# Patient Record
Sex: Female | Born: 1980 | Race: White | Hispanic: No | Smoking: Current every day smoker
Health system: Southern US, Community
[De-identification: ages and names within clinical notes are randomized; demographics above are authoritative.]

## PROBLEM LIST (undated history)

## (undated) DIAGNOSIS — F1990 Other psychoactive substance use, unspecified, uncomplicated: Secondary | ICD-10-CM

## (undated) DIAGNOSIS — F419 Anxiety disorder, unspecified: Secondary | ICD-10-CM

## (undated) DIAGNOSIS — F1910 Other psychoactive substance abuse, uncomplicated: Secondary | ICD-10-CM

---

## 2003-01-31 ENCOUNTER — Inpatient Hospital Stay (HOSPITAL_COMMUNITY): Admission: AD | Admit: 2003-01-31 | Discharge: 2003-02-03 | Payer: Self-pay | Admitting: Obstetrics and Gynecology

## 2003-03-13 ENCOUNTER — Other Ambulatory Visit: Admission: RE | Admit: 2003-03-13 | Discharge: 2003-03-13 | Payer: Self-pay | Admitting: Obstetrics and Gynecology

## 2005-01-15 ENCOUNTER — Other Ambulatory Visit: Admission: RE | Admit: 2005-01-15 | Discharge: 2005-01-15 | Payer: Self-pay | Admitting: Obstetrics and Gynecology

## 2005-01-19 ENCOUNTER — Ambulatory Visit (HOSPITAL_COMMUNITY): Admission: RE | Admit: 2005-01-19 | Discharge: 2005-01-19 | Payer: Self-pay | Admitting: Obstetrics and Gynecology

## 2005-08-08 ENCOUNTER — Inpatient Hospital Stay (HOSPITAL_COMMUNITY): Admission: AD | Admit: 2005-08-08 | Discharge: 2005-08-10 | Payer: Self-pay | Admitting: Obstetrics and Gynecology

## 2016-09-08 ENCOUNTER — Emergency Department (HOSPITAL_COMMUNITY): Payer: Self-pay

## 2016-09-08 ENCOUNTER — Encounter (HOSPITAL_COMMUNITY): Payer: Self-pay | Admitting: Radiology

## 2016-09-08 ENCOUNTER — Emergency Department (HOSPITAL_COMMUNITY)
Admission: EM | Admit: 2016-09-08 | Discharge: 2016-09-09 | Disposition: A | Discharge status: Other Acute Inpatient Hospital | Payer: Self-pay | Attending: Emergency Medicine | Admitting: Emergency Medicine

## 2016-09-08 DIAGNOSIS — Y999 Unspecified external cause status: Secondary | ICD-10-CM | POA: Insufficient documentation

## 2016-09-08 DIAGNOSIS — Y92410 Unspecified street and highway as the place of occurrence of the external cause: Secondary | ICD-10-CM | POA: Insufficient documentation

## 2016-09-08 DIAGNOSIS — R930 Abnormal findings on diagnostic imaging of skull and head, not elsewhere classified: Secondary | ICD-10-CM | POA: Insufficient documentation

## 2016-09-08 DIAGNOSIS — S161XXA Strain of muscle, fascia and tendon at neck level, initial encounter: Secondary | ICD-10-CM | POA: Insufficient documentation

## 2016-09-08 DIAGNOSIS — R402431 Glasgow coma scale score 3-8, in the field [EMT or ambulance]: Secondary | ICD-10-CM | POA: Insufficient documentation

## 2016-09-08 DIAGNOSIS — R938 Abnormal findings on diagnostic imaging of other specified body structures: Secondary | ICD-10-CM | POA: Insufficient documentation

## 2016-09-08 DIAGNOSIS — Y939 Activity, unspecified: Secondary | ICD-10-CM | POA: Insufficient documentation

## 2016-09-08 DIAGNOSIS — F17210 Nicotine dependence, cigarettes, uncomplicated: Secondary | ICD-10-CM | POA: Insufficient documentation

## 2016-09-08 DIAGNOSIS — Z79899 Other long term (current) drug therapy: Secondary | ICD-10-CM | POA: Insufficient documentation

## 2016-09-08 DIAGNOSIS — R079 Chest pain, unspecified: Secondary | ICD-10-CM | POA: Insufficient documentation

## 2016-09-08 LAB — URINALYSIS, ROUTINE W REFLEX MICROSCOPIC
Bilirubin Urine: NEGATIVE
Glucose, UA: NEGATIVE mg/dL
HGB URINE DIPSTICK: NEGATIVE
Ketones, ur: NEGATIVE mg/dL
Leukocytes, UA: NEGATIVE
NITRITE: NEGATIVE
PH: 5 (ref 5.0–8.0)
Protein, ur: NEGATIVE mg/dL
SPECIFIC GRAVITY, URINE: 1.04 — AB (ref 1.005–1.030)

## 2016-09-08 LAB — CBC
HEMATOCRIT: 39.2 % (ref 36.0–46.0)
HEMOGLOBIN: 12.8 g/dL (ref 12.0–15.0)
MCH: 28.8 pg (ref 26.0–34.0)
MCHC: 32.7 g/dL (ref 30.0–36.0)
MCV: 88.3 fL (ref 78.0–100.0)
Platelets: 369 10*3/uL (ref 150–400)
RBC: 4.44 MIL/uL (ref 3.87–5.11)
RDW: 14.9 % (ref 11.5–15.5)
WBC: 16.6 10*3/uL — ABNORMAL HIGH (ref 4.0–10.5)

## 2016-09-08 LAB — COMPREHENSIVE METABOLIC PANEL
ALBUMIN: 3.8 g/dL (ref 3.5–5.0)
ALT: 14 U/L (ref 14–54)
ANION GAP: 8 (ref 5–15)
AST: 25 U/L (ref 15–41)
Alkaline Phosphatase: 51 U/L (ref 38–126)
BILIRUBIN TOTAL: 0.4 mg/dL (ref 0.3–1.2)
BUN: 8 mg/dL (ref 6–20)
CHLORIDE: 107 mmol/L (ref 101–111)
CO2: 24 mmol/L (ref 22–32)
Calcium: 9 mg/dL (ref 8.9–10.3)
Creatinine, Ser: 1.2 mg/dL — ABNORMAL HIGH (ref 0.44–1.00)
GFR calc Af Amer: >60 mL/min (ref >60)
GFR calc non Af Amer: 57 mL/min — ABNORMAL LOW (ref >60)
GLUCOSE: 79 mg/dL (ref 65–99)
POTASSIUM: 3.9 mmol/L (ref 3.5–5.1)
SODIUM: 139 mmol/L (ref 135–145)
TOTAL PROTEIN: 6.3 g/dL — AB (ref 6.5–8.1)

## 2016-09-08 LAB — PREPARE FRESH FROZEN PLASMA
UNIT DIVISION: 0
Unit division: 0

## 2016-09-08 LAB — RAPID URINE DRUG SCREEN, HOSP PERFORMED
Amphetamines: POSITIVE — AB
Barbiturates: NOT DETECTED
Benzodiazepines: POSITIVE — AB
COCAINE: NOT DETECTED
OPIATES: POSITIVE — AB
Tetrahydrocannabinol: POSITIVE — AB

## 2016-09-08 LAB — SALICYLATE LEVEL

## 2016-09-08 LAB — I-STAT CHEM 8, ED
BUN: 8 mg/dL (ref 6–20)
CALCIUM ION: 1.09 mmol/L — AB (ref 1.15–1.40)
Chloride: 106 mmol/L (ref 101–111)
Creatinine, Ser: 1.1 mg/dL — ABNORMAL HIGH (ref 0.44–1.00)
Glucose, Bld: 89 mg/dL (ref 65–99)
HEMATOCRIT: 37 % (ref 36.0–46.0)
HEMOGLOBIN: 12.6 g/dL (ref 12.0–15.0)
Potassium: 3.5 mmol/L (ref 3.5–5.1)
SODIUM: 141 mmol/L (ref 135–145)
TCO2: 24 mmol/L (ref 0–100)

## 2016-09-08 LAB — I-STAT CG4 LACTIC ACID, ED
LACTIC ACID, VENOUS: 4.14 mmol/L — AB (ref 0.5–1.9)
Lactic Acid, Venous: 1.3 mmol/L (ref 0.5–1.9)

## 2016-09-08 LAB — CDS SEROLOGY

## 2016-09-08 LAB — ABO/RH: ABO/RH(D): A POS

## 2016-09-08 LAB — I-STAT BETA HCG BLOOD, ED (MC, WL, AP ONLY)

## 2016-09-08 LAB — ETHANOL: Alcohol, Ethyl (B): <5 mg/dL (ref <5)

## 2016-09-08 LAB — ACETAMINOPHEN LEVEL: Acetaminophen (Tylenol), Serum: <10 ug/mL — ABNORMAL LOW (ref 10–30)

## 2016-09-08 LAB — PROTIME-INR
INR: 1.09
Prothrombin Time: 14.2 seconds (ref 11.4–15.2)

## 2016-09-08 MED ORDER — LORAZEPAM 1 MG PO TABS
1.0000 mg | ORAL_TABLET | Freq: Four times a day (QID) | ORAL | Status: DC | PRN
  Administered 2016-09-08 – 2016-09-09 (×2): 1 mg via ORAL
  Filled 2016-09-08 (×2): qty 1

## 2016-09-08 MED ORDER — IOPAMIDOL (ISOVUE-300) INJECTION 61%
100.0000 mL | Freq: Once | INTRAVENOUS | Status: AC | PRN
  Administered 2016-09-08: 100 mL via INTRAVENOUS

## 2016-09-08 MED ORDER — HYDROCODONE-ACETAMINOPHEN 5-325 MG PO TABS
1.0000 | ORAL_TABLET | ORAL | Status: DC | PRN
  Administered 2016-09-08 – 2016-09-09 (×3): 1 via ORAL
  Filled 2016-09-08 (×3): qty 1

## 2016-09-08 MED ORDER — NICOTINE 21 MG/24HR TD PT24
21.0000 mg | MEDICATED_PATCH | Freq: Once | TRANSDERMAL | Status: DC
  Administered 2016-09-08: 21 mg via TRANSDERMAL
  Filled 2016-09-08: qty 1

## 2016-09-08 MED ORDER — IBUPROFEN 200 MG PO TABS
600.0000 mg | ORAL_TABLET | Freq: Once | ORAL | Status: AC
  Administered 2016-09-08: 18:00:00 600 mg via ORAL
  Filled 2016-09-08: qty 1

## 2016-09-08 MED ORDER — SODIUM CHLORIDE 0.9 % IV BOLUS (SEPSIS)
1000.0000 mL | Freq: Once | INTRAVENOUS | Status: AC
  Administered 2016-09-08: 1000 mL via INTRAVENOUS

## 2016-09-08 MED ORDER — SILVER SULFADIAZINE 1 % EX CREA
TOPICAL_CREAM | Freq: Once | CUTANEOUS | Status: AC
  Administered 2016-09-09: 1 via TOPICAL
  Filled 2016-09-08: qty 85

## 2016-09-08 MED ORDER — LORAZEPAM 2 MG/ML IJ SOLN
1.0000 mg | Freq: Once | INTRAMUSCULAR | Status: AC
  Administered 2016-09-08: 1 mg via INTRAMUSCULAR
  Filled 2016-09-08: qty 1
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
## 2016-09-08 NOTE — ED Notes (Addendum)
Pt is online with TSS.
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
## 2016-09-08 NOTE — BH Assessment (Signed)
IVC received and provided to registration. Kathlene NovemberMike, RN has been notified and the pt's bed number is pending assignment.   Princess BruinsAquicha Duff, MSW, Theresia MajorsLCSWA
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
## 2016-09-08 NOTE — ED Notes (Signed)
PD talking with pt to inform her of IVC status.

## 2016-09-08 NOTE — ED Notes (Signed)
Mother, stepfather and brother of patient have come by to check on patient. Patient does not want visitors. Family went home and will call later to check on status.
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
## 2016-09-08 NOTE — BH Assessment (Addendum)
Spoke with Kathlene NovemberMike, RN and advised [facility] receipt of the [facility] for the pt. Kathlene NovemberMike, RN verbalized understanding and reports he will have it faxed. Bed number will be assigned once the necessary paperwork has been received.  Princess BruinsAquicha Duff, MSW, Theresia MajorsLCSWA

## 2016-09-08 NOTE — ED Notes (Signed)
Got patient on the monitor did vitals

## 2016-09-08 NOTE — ED Notes (Signed)
ED Provider at bedside. 

## 2016-09-08 NOTE — ED Notes (Signed)
No fast exam completed.

## 2016-09-09 ENCOUNTER — Inpatient Hospital Stay (HOSPITAL_COMMUNITY)
Admission: EM | Admit: 2016-09-09 | Discharge: 2016-09-12 | DRG: 897 | Disposition: A | Discharge status: Home / Self Care | Payer: Federal, State, Local not specified - Other | Source: Intra-hospital | Attending: Psychiatry | Admitting: Psychiatry

## 2016-09-09 ENCOUNTER — Encounter (HOSPITAL_COMMUNITY): Payer: Self-pay

## 2016-09-09 DIAGNOSIS — F1290 Cannabis use, unspecified, uncomplicated: Secondary | ICD-10-CM | POA: Diagnosis present

## 2016-09-09 DIAGNOSIS — F17210 Nicotine dependence, cigarettes, uncomplicated: Secondary | ICD-10-CM | POA: Diagnosis present

## 2016-09-09 DIAGNOSIS — F1120 Opioid dependence, uncomplicated: Secondary | ICD-10-CM | POA: Diagnosis present

## 2016-09-09 DIAGNOSIS — F0630 Mood disorder due to known physiological condition, unspecified: Secondary | ICD-10-CM | POA: Diagnosis present

## 2016-09-09 DIAGNOSIS — F1920 Other psychoactive substance dependence, uncomplicated: Secondary | ICD-10-CM | POA: Diagnosis present

## 2016-09-09 DIAGNOSIS — R45851 Suicidal ideations: Secondary | ICD-10-CM | POA: Diagnosis present

## 2016-09-09 LAB — TYPE AND SCREEN
ABO/RH(D): A POS
ANTIBODY SCREEN: NEGATIVE
UNIT DIVISION: 0
Unit division: 0

## 2016-09-09 LAB — BLOOD PRODUCT ORDER (VERBAL) VERIFICATION

## 2016-09-09 MED ORDER — LORAZEPAM 1 MG PO TABS
1.0000 mg | ORAL_TABLET | Freq: Four times a day (QID) | ORAL | Status: DC | PRN
  Administered 2016-09-10 – 2016-09-11 (×2): 1 mg via ORAL
  Filled 2016-09-09 (×2): qty 1

## 2016-09-09 MED ORDER — ONDANSETRON 4 MG PO TBDP: 4.0000 mg | ORAL_TABLET | Freq: Four times a day (QID) | ORAL | Status: DC | PRN

## 2016-09-09 MED ORDER — MAGNESIUM HYDROXIDE 400 MG/5ML PO SUSP
30.0000 mL | Freq: Every day | ORAL | Status: DC | PRN
Start: 2016-09-09 — End: 2016-09-12
  Administered 2016-09-10: 30 mL via ORAL
  Filled 2016-09-09: qty 30

## 2016-09-09 MED ORDER — IBUPROFEN 400 MG PO TABS
400.0000 mg | ORAL_TABLET | Freq: Once | ORAL | Status: AC
  Administered 2016-09-09: 400 mg via ORAL
  Filled 2016-09-09: qty 1

## 2016-09-09 MED ORDER — ONDANSETRON 4 MG PO TBDP
4.0000 mg | ORAL_TABLET | Freq: Four times a day (QID) | ORAL | Status: DC | PRN
Start: 2016-09-09 — End: 2016-09-09

## 2016-09-09 MED ORDER — NAPROXEN 500 MG PO TABS
500.0000 mg | ORAL_TABLET | Freq: Once | ORAL | Status: AC
  Administered 2016-09-09: 500 mg via ORAL
  Filled 2016-09-09 (×2): qty 1

## 2016-09-09 MED ORDER — VITAMIN B-1 100 MG PO TABS
100.0000 mg | ORAL_TABLET | Freq: Every day | ORAL | Status: DC
  Administered 2016-09-10 – 2016-09-12 (×3): 100 mg via ORAL
  Filled 2016-09-09 (×5): qty 1

## 2016-09-09 MED ORDER — TRAZODONE HCL 50 MG PO TABS
50.0000 mg | ORAL_TABLET | Freq: Every evening | ORAL | Status: DC | PRN
  Administered 2016-09-09: 50 mg via ORAL
  Filled 2016-09-09: qty 1

## 2016-09-09 MED ORDER — ADULT MULTIVITAMIN W/MINERALS CH
1.0000 | ORAL_TABLET | Freq: Every day | ORAL | Status: DC
  Administered 2016-09-09 – 2016-09-12 (×4): 1 via ORAL
  Filled 2016-09-09 (×6): qty 1

## 2016-09-09 MED ORDER — CLONIDINE HCL 0.1 MG PO TABS
0.1000 mg | ORAL_TABLET | Freq: Every day | ORAL | Status: DC
  Filled 2016-09-09: qty 1

## 2016-09-09 MED ORDER — THIAMINE HCL 100 MG/ML IJ SOLN
100.0000 mg | Freq: Once | INTRAMUSCULAR | Status: AC
  Administered 2016-09-09: 100 mg via INTRAMUSCULAR
  Filled 2016-09-09: qty 2

## 2016-09-09 MED ORDER — ALUM & MAG HYDROXIDE-SIMETH 200-200-20 MG/5ML PO SUSP: 30.0000 mL | ORAL | Status: DC | PRN

## 2016-09-09 MED ORDER — NICOTINE 21 MG/24HR TD PT24
21.0000 mg | MEDICATED_PATCH | Freq: Every day | TRANSDERMAL | Status: DC
  Administered 2016-09-09 – 2016-09-12 (×4): 21 mg via TRANSDERMAL
  Filled 2016-09-09 (×7): qty 1

## 2016-09-09 MED ORDER — METHOCARBAMOL 500 MG PO TABS
500.0000 mg | ORAL_TABLET | Freq: Three times a day (TID) | ORAL | Status: DC | PRN
  Administered 2016-09-09 – 2016-09-12 (×7): 500 mg via ORAL
  Filled 2016-09-09 (×7): qty 1

## 2016-09-09 MED ORDER — TRAZODONE HCL 50 MG PO TABS
50.0000 mg | ORAL_TABLET | Freq: Every day | ORAL | Status: DC
  Filled 2016-09-09: qty 1

## 2016-09-09 MED ORDER — DICYCLOMINE HCL 20 MG PO TABS
20.0000 mg | ORAL_TABLET | Freq: Four times a day (QID) | ORAL | Status: DC | PRN
  Administered 2016-09-09: 20 mg via ORAL
  Filled 2016-09-09: qty 1

## 2016-09-09 MED ORDER — CLONIDINE HCL 0.1 MG PO TABS
0.1000 mg | ORAL_TABLET | Freq: Four times a day (QID) | ORAL | Status: AC
  Administered 2016-09-09 – 2016-09-11 (×10): 0.1 mg via ORAL
  Filled 2016-09-09 (×13): qty 1

## 2016-09-09 MED ORDER — CLONIDINE HCL 0.1 MG PO TABS
0.1000 mg | ORAL_TABLET | ORAL | Status: DC
  Administered 2016-09-12: 0.1 mg via ORAL
  Filled 2016-09-09 (×4): qty 1

## 2016-09-09 MED ORDER — ACETAMINOPHEN 325 MG PO TABS
650.0000 mg | ORAL_TABLET | Freq: Four times a day (QID) | ORAL | Status: DC | PRN
  Administered 2016-09-09 – 2016-09-11 (×3): 650 mg via ORAL
  Filled 2016-09-09 (×4): qty 2

## 2016-09-09 MED ORDER — TRAZODONE HCL 50 MG PO TABS
50.0000 mg | ORAL_TABLET | Freq: Every evening | ORAL | Status: DC | PRN
  Administered 2016-09-09 – 2016-09-11 (×3): 50 mg via ORAL
  Filled 2016-09-09 (×10): qty 1

## 2016-09-09 MED ORDER — NAPROXEN 500 MG PO TABS
500.0000 mg | ORAL_TABLET | Freq: Two times a day (BID) | ORAL | Status: DC | PRN
  Administered 2016-09-09 – 2016-09-12 (×3): 500 mg via ORAL
  Filled 2016-09-09 (×3): qty 1

## 2016-09-09 MED ORDER — LOPERAMIDE HCL 2 MG PO CAPS: 2.0000 mg | ORAL_CAPSULE | ORAL | Status: DC | PRN

## 2016-09-09 MED ORDER — HYDROXYZINE HCL 25 MG PO TABS
25.0000 mg | ORAL_TABLET | Freq: Four times a day (QID) | ORAL | Status: DC | PRN
  Administered 2016-09-09: 25 mg via ORAL
  Filled 2016-09-09: qty 1

## 2016-09-09 MED ORDER — METHOCARBAMOL 500 MG PO TABS: 500.0000 mg | ORAL_TABLET | Freq: Four times a day (QID) | ORAL | Status: DC | PRN

## 2016-09-09 MED ORDER — HYDROXYZINE HCL 25 MG PO TABS
25.0000 mg | ORAL_TABLET | Freq: Four times a day (QID) | ORAL | Status: DC | PRN
  Administered 2016-09-09 – 2016-09-11 (×3): 25 mg via ORAL
  Filled 2016-09-09 (×3): qty 1
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
## 2016-09-09 NOTE — ED Notes (Signed)
Pt tearful when speaking to husband on phone, [facility] to [facility] to give to her husband so that he will be aware of visiting hours and guidelines, pt asked to end phone call d/t becoming tearful, pt cooperative, [facility] shower [facility]

## 2016-09-09 NOTE — Progress Notes (Signed)
Patient stated she is not pregnant.  "I've had 6 kids and had my tubes tied several years ago."  Pharmacy informed.
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
## 2016-09-09 NOTE — Tx Team (Addendum)
Initial Treatment Plan 09/09/2016 1:03 PM [name] ZOX:[id number]    PATIENT STRESSORS: Financial difficulties Marital or family conflict Substance abuse   PATIENT STRENGTHS: Ability for insight Capable of independent living Communication skills Physical Health   PATIENT IDENTIFIED PROBLEMS: Substance abuse  Depression  Anxiety  Suicidal ideation  "Not listen to what others think of me"  'I want to be happy again"           DISCHARGE CRITERIA:  Ability to meet basic life and health needs Adequate post-discharge living arrangements Improved stabilization in mood, thinking, and/or behavior Motivation to continue treatment in a less acute level of care  PRELIMINARY DISCHARGE PLAN: Attend aftercare/continuing care group Attend PHP/IOP Attend 12-step recovery group Outpatient therapy Placement in alternative living arrangements  PATIENT/FAMILY INVOLVEMENT: This treatment plan has been presented to and reviewed with the patient, Eileen Addisonrystal Borowiak, and/or family member.  The patient and family have been given the opportunity to ask questions and make suggestions.  Bethann PunchesJane O Ochieng, RN 09/09/2016, 1:03 PM
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
## 2016-09-09 NOTE — ED Notes (Signed)
BH called and informed this RN that Ms. Eileen [name] has been accepted and has a bed, 306 Room 2, but cannot come until 0800 09/09/16.

## 2016-09-09 NOTE — ED Notes (Signed)
Resting/ sleeping, NAD, calm, lying in bed, rise and fall of chest noted, repositions self, sitter present, breakfast ordered.  

## 2016-09-09 NOTE — ED Notes (Signed)
Patient using phone at this time.

## 2016-09-09 NOTE — ED Notes (Signed)
[facility]
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
## 2016-09-10 NOTE — Plan of Care (Signed)
Problem: Education: Goal: Ability to make informed decisions regarding treatment will improve Outcome: Progressing Nurse discussed depression/coping skills with patient.   
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
## 2016-09-10 NOTE — Progress Notes (Signed)
Recreation Therapy Notes  Date: 09/10/16 Time: 0930 Location: 300 Hall Group Room  Group Topic: Stress Management  Goal Area(s) Addresses:  Patient will verbalize importance of using healthy stress management.  Patient will identify positive emotions associated with healthy stress management.   Intervention: Calm App  Activity :  Gratitude Meditation.  LRT introduced the stress management technique of meditation to group.  LRT played the meditation from the Calm app to allow patients to focus on things they should be grateful for and not the things they don't have.  Patients were to follow along as the meditation played to engage in the technique.  Education:  Stress Management, Discharge Planning.   Education Outcome: Acknowledges edcuation/In group clarification offered/Needs additional education  Clinical Observations/Feedback: Pt did not attend group.   Caroll RancherMarjette Ahana Najera, LRT/CTRS         Caroll RancherLindsay, Kashis Penley A 09/10/2016 11:37 AM

## 2016-09-10 NOTE — Progress Notes (Signed)
D: Pt was in the hallway upon initial approach.  Pt presents with anxious affect and depressed mood.  Her goal is to "sleep all night."  Pt denies SI/HI, denies hallucinations, reports generalized pain of 8/10, complains of withdrawal symptoms of "sweats."  Pt has been visible in milieu interacting with peers and staff appropriately.  Pt attended evening group.   A: Introduced self to pt.  Actively listened to pt and offered support and encouragement. Medications administered per order.  PRN medication administered for pain, anxiety, muscle spasms.  Pt continued to complain of pain and insomnia.  On-site provider notified and Naproxen 500 mg POX1 ordered and administered, repeat dose of Trazodone 50 mg PO ordered and administered.   R: Pt is safe on the unit.  Pt is compliant with medications.  Pt verbally contracts for safety.  Will continue to monitor and assess.
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
## 2016-09-10 NOTE — Tx Team (Signed)
Interdisciplinary Treatment and Diagnostic Plan Update  09/10/2016 Time of Session: 9:30am Eileen [name] MRN: [id number]  Principal Diagnosis:  Active Problems:   Polysubstance dependence including opioid drug with daily use (HCC)   Current Medications:  Current Facility-Administered Medications  Medication Dose Route Frequency Provider Last Rate Last Dose  . acetaminophen (TYLENOL) tablet 650 mg  650 mg Oral Q6H PRN Sanjuana Kava, NP   650 mg at 09/10/16 4098  . alum & mag hydroxide-simeth (MAALOX/MYLANTA) 200-200-20 MG/5ML suspension 30 mL  30 mL Oral Q4H PRN Sanjuana Kava, NP      . cloNIDine (CATAPRES) tablet 0.1 mg  0.1 mg Oral QID Sanjuana Kava, NP   0.1 mg at 09/10/16 0805   Followed by  . [START ON 09/12/2016] cloNIDine (CATAPRES) tablet 0.1 mg  0.1 mg Oral BH-qamhs Sanjuana Kava, NP       Followed by  . [START ON 09/14/2016] cloNIDine (CATAPRES) tablet 0.1 mg  0.1 mg Oral QAC breakfast Sanjuana Kava, NP      . dicyclomine (BENTYL) tablet 20 mg  20 mg Oral Q6H PRN Sanjuana Kava, NP   20 mg at 09/09/16 1546  . hydrOXYzine (ATARAX/VISTARIL) tablet 25 mg  25 mg Oral Q6H PRN Craige Cotta, MD   25 mg at 09/10/16 0814  . loperamide (IMODIUM) capsule 2-4 mg  2-4 mg Oral PRN Craige Cotta, MD      . LORazepam (ATIVAN) tablet 1 mg  1 mg Oral Q6H PRN Rockey Situ Cobos, MD      . magnesium hydroxide (MILK OF MAGNESIA) suspension 30 mL  30 mL Oral Daily PRN Sanjuana Kava, NP      . methocarbamol (ROBAXIN) tablet 500 mg  500 mg Oral Q8H PRN Sanjuana Kava, NP   500 mg at 09/10/16 0814  . multivitamin with minerals tablet 1 tablet  1 tablet Oral Daily Craige Cotta, MD   1 tablet at 09/10/16 0805  . naproxen (NAPROSYN) tablet 500 mg  500 mg Oral BID PRN Sanjuana Kava, NP   500 mg at 09/09/16 1545  . nicotine (NICODERM CQ - dosed in mg/24 hours) patch 21 mg  21 mg Transdermal Daily Craige Cotta, MD   21 mg at 09/10/16 0806  . ondansetron (ZOFRAN-ODT) disintegrating tablet 4 mg  4  mg Oral Q6H PRN Craige Cotta, MD      . thiamine (VITAMIN B-1) tablet 100 mg  100 mg Oral Daily Craige Cotta, MD   100 mg at 09/10/16 0806  . traZODone (DESYREL) tablet 50 mg  50 mg Oral QHS,MR X 1 Jackelyn Poling, NP   50 mg at 09/09/16 2258   PTA Medications: Prescriptions Prior to Admission  Medication Sig Dispense Refill Last Dose  . carboxymethylcellulose (REFRESH PLUS) 0.5 % SOLN Place 1 drop into the right eye daily as needed (dryness).   09/07/2016 at Unknown time  . clonazePAM (KLONOPIN) 0.5 MG tablet Take 0.5 mg by mouth 2 (two) times daily as needed for anxiety.   09/07/2016 at Unknown time    Patient Stressors: Financial difficulties Marital or family conflict Substance abuse  Patient Strengths: Ability for insight Capable of independent living Communication skills Physical Health  Treatment Modalities: Medication Management, Group therapy, Case management,  1 to 1 session with clinician, Psychoeducation, Recreational therapy.   Physician Treatment Plan for Primary Diagnosis:  Active Problems:   Polysubstance dependence including opioid drug with daily use (HCC)  Long Term Goal(s): Improvement in symptoms so as ready for discharge Improvement in symptoms so as ready for discharge   Short Term Goals: Ability to verbalize feelings will improve Ability to disclose and discuss suicidal ideas Ability to demonstrate self-control will improve Ability to identify and develop effective coping behaviors will improve Ability to maintain clinical measurements within normal limits will improve Ability to identify triggers associated with substance abuse/mental health issues will improve Ability to verbalize feelings will improve Ability to disclose and discuss suicidal ideas Ability to demonstrate self-control will improve Ability to identify and develop effective coping behaviors will improve Ability to maintain clinical measurements within normal limits will  improve Ability to identify triggers associated with substance abuse/mental health issues will improve     Medication Management: Evaluate patient's response, side effects, and tolerance of medication regimen.  Therapeutic Interventions: 1 to 1 sessions, Unit Group sessions and Medication administration.  Evaluation of Outcomes: Progressing   RN Treatment Plan for Primary Diagnosis: Active Problems:   Polysubstance dependence including opioid drug with daily use (HCC)  Long Term Goal(s): Knowledge of disease and therapeutic regimen to maintain health will improve  Short Term Goals: Ability to remain free from injury will improve, Ability to disclose and discuss suicidal ideas, Ability to identify and develop effective coping behaviors will improve and Compliance with prescribed medications will improve  Medication Management: RN will administer medications as ordered by provider, will assess and evaluate patient's response and provide education to patient for prescribed medication. RN will report any adverse and/or side effects to prescribing provider.  Therapeutic Interventions: 1 on 1 counseling sessions, Psychoeducation, Medication administration, Evaluate responses to treatment, Monitor vital signs and CBGs as ordered, Perform/monitor CIWA, COWS, AIMS and Fall Risk screenings as ordered, Perform wound care treatments as ordered.  Evaluation of Outcomes: Progressing   LCSW Treatment Plan for Primary Diagnosis: Active Problems:   Polysubstance dependence including opioid drug with daily use (HCC)  Long Term Goal(s): Safe transition to appropriate next level of care at discharge, Engage patient in therapeutic group addressing interpersonal concerns.  Short Term Goals: Engage patient in aftercare planning with referrals and resources, Increase social support, Increase emotional regulation, Identify triggers associated with mental health/substance abuse issues and Increase skills for  wellness and recovery  Therapeutic Interventions: Assess for all discharge needs, 1 to 1 time with Social worker, Explore available resources and support systems, Assess for adequacy in community support network, Educate family and significant other(s) on suicide prevention, Complete Psychosocial Assessment, Interpersonal group therapy.  Evaluation of Outcomes: Progressing    Progress in Treatment :  Attending groups: Continuing to assess  Participating in groups: Continuing to assess  Taking medication as prescribed: Yes, MD continuing to assess for appropriate medication regimen  Toleration medication: Yes  Family/Significant other contact made: Treatment team assessing for appropriate contacts  Patient understands diagnosis: Yes  Discussing patient identified problems/goals with staff: Yes  Medical problems stabilized or resolved: Yes  Denies suicidal/homicidal ideation: Treatment team continuing to asses  Issues/concerns per patient self-inventory: None reported  Other: N/A  New problem(s) identified: None reported at this time    New Short Term/Long Term Goal(s): None at this time    Discharge Plan or Barriers: Treatment team continuing to assess.    Reason for Continuation of Hospitalization: Anxiety Depression Medication stabilization Suicidal Ideations Withdrawal symptoms  Estimated Length of Stay: 3-5 days    Attendees:  Patient:   Physician: Dr. Jama Flavorsobos, Dr. Vanetta ShawlHisada , MD  09/10/2016  9:30am  Nursing: Jan Mellody DanceWright, Beverly Knight, Sheila, Penny Carter 09/10/2016 9:30am  RN Care Manager:   Social Workers:  Samuella BruinKristin Davyon Fisch, [place],   09/10/2016 9:30am  Nurse Pratictioners:  Armandina StammerAgnes Nwoko, Claudette Headonrad Withrow, NP 09/10/2016 9:30am  Other:     Scribe for Treatment Team: Samuella BruinKristin Azavion Bouillon, LCSW Clinical Social Worker [facility] [phone]
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
## 2016-09-10 NOTE — [facility] Suicide Risk Assessment (Signed)
[facility] INPATIENT:  Family/Significant Other Suicide Prevention Education  Suicide Prevention Education:  Patient Refusal for Family/Significant Other Suicide Prevention Education: The patient Eileen [name] has refused to provide written consent for family/significant other to be provided Family/Significant Other Suicide Prevention Education during admission and/or prior to discharge.  Physician notified.  Daisy Floroandace L Cinderella Christoffersen MSW, LCSWA  09/10/2016, 4:51 PM

## 2016-09-10 NOTE — Progress Notes (Signed)
D.  Pt presents with flat affect upon approach, denies complaints other than withdrawal at this time.  Pt was positive for evening AA group, observed interacting appropriately with peers on the unit.  A.  Support and encouragement offered, medication given as ordered for withdrawal.  R.  Pt remains safe on the unit, will continue to monitor.

## 2016-09-10 NOTE — [facility] Group Notes (Signed)
[facility] LCSW Group Therapy  09/10/2016 2:27 PM  Type of Therapy:  Group Therapy  Participation Level:  Invited, chose not to attend  Summary of Progress/Problems: Feelings around Relapse. Group members discussed the meaning of relapse and shared personal stories of relapse, how it affected them and others, and how they perceived themselves during this time. Group members were encouraged to identify triggers, warning signs and coping skills used when facing the possibility of relapse. Social supports were discussed and explored in detail. Post Acute Withdrawal Syndrome (handout provided) was introduced and examined. Pt's were encouraged to ask questions, talk about key points associated with PAWS, and process this information in terms of relapse prevention.    Val Schiavo C Briante Loveall 09/10/2016, 2:27 PM   

## 2016-09-10 NOTE — Progress Notes (Signed)
Pt attended AA meeting this evening.  
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
## 2016-09-10 NOTE — Plan of Care (Signed)
Problem: Medication: Goal: Compliance with prescribed medication regimen will improve Outcome: Progressing Pt has been compliant with scheduled medications tonight.    

## 2016-09-11 DIAGNOSIS — F1920 Other psychoactive substance dependence, uncomplicated: Principal | ICD-10-CM

## 2016-09-11 DIAGNOSIS — F0630 Mood disorder due to known physiological condition, unspecified: Secondary | ICD-10-CM

## 2016-09-11 DIAGNOSIS — F17210 Nicotine dependence, cigarettes, uncomplicated: Secondary | ICD-10-CM

## 2016-09-11 DIAGNOSIS — Z79899 Other long term (current) drug therapy: Secondary | ICD-10-CM

## 2016-09-11 LAB — COMPREHENSIVE METABOLIC PANEL
ALT: 13 U/L — AB (ref 14–54)
AST: 16 U/L (ref 15–41)
Albumin: 3.8 g/dL (ref 3.5–5.0)
Alkaline Phosphatase: 50 U/L (ref 38–126)
Anion gap: 7 (ref 5–15)
BUN: 13 mg/dL (ref 6–20)
CHLORIDE: 106 mmol/L (ref 101–111)
CO2: 25 mmol/L (ref 22–32)
CREATININE: 0.91 mg/dL (ref 0.44–1.00)
Calcium: 9 mg/dL (ref 8.9–10.3)
GFR calc Af Amer: >60 mL/min (ref >60)
GFR calc non Af Amer: >60 mL/min (ref >60)
Glucose, Bld: 146 mg/dL — ABNORMAL HIGH (ref 65–99)
POTASSIUM: 4.2 mmol/L (ref 3.5–5.1)
SODIUM: 138 mmol/L (ref 135–145)
Total Bilirubin: 0.5 mg/dL (ref 0.3–1.2)
Total Protein: 6.7 g/dL (ref 6.5–8.1)

## 2016-09-11 LAB — CBC WITH DIFFERENTIAL/PLATELET
BASOS ABS: 0 10*3/uL (ref 0.0–0.1)
Basophils Relative: 0 %
EOS ABS: 0.4 10*3/uL (ref 0.0–0.7)
Eosinophils Relative: 3 %
HCT: 38.6 % (ref 36.0–46.0)
Hemoglobin: 12.7 g/dL (ref 12.0–15.0)
Lymphocytes Relative: 19 %
Lymphs Abs: 2.3 10*3/uL (ref 0.7–4.0)
MCH: 29.1 pg (ref 26.0–34.0)
MCHC: 32.9 g/dL (ref 30.0–36.0)
MCV: 88.3 fL (ref 78.0–100.0)
MONO ABS: 0.5 10*3/uL (ref 0.1–1.0)
Monocytes Relative: 4 %
NEUTROS PCT: 74 %
Neutro Abs: 8.9 10*3/uL — ABNORMAL HIGH (ref 1.7–7.7)
PLATELETS: 376 10*3/uL (ref 150–400)
RBC: 4.37 MIL/uL (ref 3.87–5.11)
RDW: 14.8 % (ref 11.5–15.5)
WBC: 12.1 10*3/uL — AB (ref 4.0–10.5)

## 2016-09-11 MED ORDER — VALACYCLOVIR HCL 500 MG PO TABS
1000.0000 mg | ORAL_TABLET | Freq: Every day | ORAL | Status: DC
  Administered 2016-09-11 – 2016-09-12 (×2): 1000 mg via ORAL
  Filled 2016-09-11 (×5): qty 2
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
## 2016-09-11 NOTE — [facility] Group Notes (Signed)
Nursing Psycho-educational Group:   Patient attended and actively participated in nursing group. Cooperative and engaged.  She said she wants to "do better".

## 2016-09-11 NOTE — Plan of Care (Signed)
Problem: Activity: Goal: Interest or engagement in activities will improve Outcome: Progressing Pt has attended evening groups on her hall this weekend

## 2016-09-11 NOTE — Progress Notes (Signed)
[facility] Group Notes:  (Nursing/MHT/Case Management/Adjunct)  Date:  09/11/2016  Time:  2030 Type of Therapy:  wrap up group  Participation Level:  Active  Participation Quality:  Appropriate, Attentive, Sharing and Supportive  Affect:  Depressed  Cognitive:  Appropriate  Insight:  Improving  Engagement in Group:  Engaged  Modes of Intervention:  Clarification, Education and Support  Summary of Progress/Problems: Pt shares that she is ready to be a mom to her girls again. Pt reported having made bad decisions and searched out old friends which led to using. Pt was disappointed that she would not likely discharge Christmas eve because she wanted to see her sick grandmother. Pt reports probably going to do time in jail after discharge.   Marcille BuffyMcNeil, Teshara Moree S 09/11/2016, 10:11 PM

## 2016-09-11 NOTE — [facility] Group Notes (Signed)
[facility] LCSW Group Therapy Note  09/11/2016 at 10 to 11 AM  Type of Therapy and Topic:  Group Therapy: Avoiding Self-Sabotaging and Enabling Behaviors  Participation Level:  Did Not Attend despite overhead announcement and CSW knocking on patient's door asking him to attend.   Summary of Progress/Problems:  The main focus of today's process group was for the patient to identify ways in which they have in the past sabotaged their own recovery or even sabotaged the holidays for themselves and perhaps others.   Eileen [name] C Zechariah Bissonnette, LCSW   
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
## 2016-09-12 DIAGNOSIS — F0630 Mood disorder due to known physiological condition, unspecified: Secondary | ICD-10-CM

## 2016-09-12 MED ORDER — TRAZODONE HCL 50 MG PO TABS
50.0000 mg | ORAL_TABLET | Freq: Every day | ORAL | Status: DC
  Filled 2016-09-12 (×2): qty 7

## 2016-09-12 MED ORDER — VALACYCLOVIR HCL 1 G PO TABS: 1000.0000 mg | ORAL_TABLET | Freq: Every day | ORAL | 0 refills | Status: DC

## 2016-09-12 MED ORDER — TRAZODONE HCL 50 MG PO TABS: ORAL_TABLET | ORAL | 0 refills | Status: DC
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
## 2016-09-12 NOTE — Progress Notes (Signed)
  [facility] :  Will you be returning to the same living situation after discharge:  Yes,  with spouse At discharge, do you have transportation home?: Yes,  family Do you have the ability to pay for your medications: No, states has no income and no insurance - outpatient provider should be helping with this  Release of information consent forms completed and in the chart;  Patient's signature needed at discharge.  Patient to Follow up at: Follow-up Information    [facility] Follow up.   Specialty:  Professional Counselor Why:  Please use [facility] to establish for services.  Hours are Monday - Friday from 8:30 - 12. Please arrive early for prompt service, bring hospital discharge paperwork. You will be assessed for medications management and therapy as needed.   Contact information: [address] [place] [phone]           Next level of care provider has access to [facility] Link:no  Safety Planning and Suicide Prevention discussed: Yes,  with patient only  Have you used any form of tobacco in the last 30 days? (Cigarettes, Smokeless Tobacco, Cigars, and/or Pipes): Yes  Has patient been referred to the Quitline?: Patient refused referral  Patient has been referred for addiction treatment: Yes  Lynnell ChadMareida J Grossman-Orr 09/12/2016, 11:25 AM

## 2016-09-12 NOTE — [facility] Group Notes (Signed)
  [facility] LCSW Group Therapy Note   09/12/2016  10 to 10:45 AM   Type of Therapy and Topic: Group Therapy: Feelings Around Returning Home & Establishing a Supportive Framework and Activity to Identify signs of Improvement or Decompensation   Participation Level: Appropriate   Description of Group:  Patients first processed thoughts and feelings about up coming discharge. These included fears of upcoming changes, lack of change, new living environments, judgements and expectations from others and overall stigma of MH issues. We then discussed what is a supportive framework? What does it look like feel like and how do I discern it from and unhealthy non-supportive network? Learn how to cope when supports are not helpful and don't support you. Discuss what to do when your family/friends are not supportive.   Therapeutic Goals Addressed in Processing Group:  1. Patient will identify one healthy supportive network that they can use at discharge. 2. Patient will identify one factor of a supportive framework and how to tell it from an unhealthy network. 3. Patient able to identify one coping skill to use when they do not have positive supports from others. 4. Patient will demonstrate ability to communicate their needs through discussion and/or role plays.  Summary of Patient Progress:  Pt engaged easily during group session. As patients processed their anxiety about discharge and described healthy supports patient shared difficulties she will be facing upon discharge with family and unknowns regarding incarceration.  Patient chose a visual to represent decompensation as isolation and improvement as freedom. Patient processed helpfulness of visuals.   Carney Bernatherine C Harrill, LCSW
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
## 2016-09-12 NOTE — [facility] Suicide Risk Assessment (Signed)
[facility]   Principal Problem: <principal problem not specified> Discharge Diagnoses:  Patient Active Problem List   Diagnosis Date Noted  . Polysubstance dependence including opioid drug with daily use (HCC) [F19.20] 09/09/2016    Total Time spent with patient: 30 minutes  Musculoskeletal: Strength & Muscle Tone: within normal limits Gait & Station: normal Patient leans: no lean  Psychiatric Specialty Exam: Review of Systems  Cardiovascular: Negative for chest pain.  Gastrointestinal: Negative for nausea.  Psychiatric/Behavioral: Negative for depression, substance abuse and suicidal ideas.    Blood pressure 112/71, pulse 90, temperature 98.8 F (37.1 C), temperature source Oral, resp. rate 18, height 5' 2.5" (1.588 m), weight 62.7 kg (138 lb 4 oz), last menstrual period 08/18/2016.Body mass index is 24.88 kg/m.  General Appearance: Casual  Eye Contact::  Fair  Speech:  Normal Rate409  Volume:  Normal  Mood:  Euthymic  Affect:  Congruent  Thought Process:  Goal Directed  Orientation:  Full (Time, Place, and Person)  Thought Content:  Logical  Suicidal Thoughts:  No  Homicidal Thoughts:  No  Memory:  Immediate;   Fair Recent;   Fair  Judgement:  Fair  Insight:  Fair  Psychomotor Activity:  Normal  Concentration:  Fair  Recall:  Good  Fund of Knowledge:Good  Language: Good  Akathisia:  Negative  Handed:  Right  AIMS (if indicated):     Assets:  Desire for Improvement  Sleep:  Number of Hours: 6.25  Cognition: WNL  ADL's:  Intact   Mental Status Per Nursing Assessment::   On Admission:     Demographic Factors:  White female married  Loss Factors: relationship issues  Historical Factors: Impulsivity  Risk Reduction Factors:   Living with another person, especially a relative and Positive therapeutic relationship  Continued Clinical Symptoms:  Alcohol/Substance Abuse/Dependencies  Cognitive Features That Contribute To  Risk:  None    Suicide Risk:  Minimal: No identifiable suicidal ideation.  Patients presenting with no risk factors but with morbid ruminations; may be classified as minimal risk based on the severity of the depressive symptoms  Follow-up Information    [facility] Follow up.   Specialty:  Professional Counselor Why:  Please use Walk In Clinic to establish for services.  Hours are Monday - Friday from 8:30 - 12. Please arrive early for prompt service, bring hospital discharge paperwork. You will be assessed for medications management and therapy as needed.   Contact information: [address] [place] [id number] [phone]         Follow up with all recommendations and referrals.  sobreity and compliance with support groups  Plan Of Care/Follow-up recommendations:  Activity:  as tolerated Diet:  regular  Thresa RossAKHTAR, Mosella Kasa, MD 09/12/2016, 10:21 AM

## 2017-01-20 ENCOUNTER — Emergency Department (HOSPITAL_BASED_OUTPATIENT_CLINIC_OR_DEPARTMENT_OTHER): Payer: Medicaid Other

## 2017-01-20 ENCOUNTER — Encounter (HOSPITAL_BASED_OUTPATIENT_CLINIC_OR_DEPARTMENT_OTHER): Payer: Self-pay | Admitting: *Deleted

## 2017-01-20 ENCOUNTER — Observation Stay (HOSPITAL_BASED_OUTPATIENT_CLINIC_OR_DEPARTMENT_OTHER)
Admission: EM | Admit: 2017-01-20 | Discharge: 2017-01-22 | Discharge status: Against Medical Advice | Payer: Medicaid Other | Attending: Family Medicine | Admitting: Family Medicine

## 2017-01-20 DIAGNOSIS — Z79899 Other long term (current) drug therapy: Secondary | ICD-10-CM | POA: Insufficient documentation

## 2017-01-20 DIAGNOSIS — A419 Sepsis, unspecified organism: Principal | ICD-10-CM

## 2017-01-20 DIAGNOSIS — F1920 Other psychoactive substance dependence, uncomplicated: Secondary | ICD-10-CM | POA: Diagnosis present

## 2017-01-20 DIAGNOSIS — R7989 Other specified abnormal findings of blood chemistry: Secondary | ICD-10-CM

## 2017-01-20 DIAGNOSIS — R059 Cough, unspecified: Secondary | ICD-10-CM

## 2017-01-20 DIAGNOSIS — F0630 Mood disorder due to known physiological condition, unspecified: Secondary | ICD-10-CM | POA: Insufficient documentation

## 2017-01-20 DIAGNOSIS — Y9355 Activity, bike riding: Secondary | ICD-10-CM | POA: Diagnosis not present

## 2017-01-20 DIAGNOSIS — E041 Nontoxic single thyroid nodule: Secondary | ICD-10-CM | POA: Diagnosis not present

## 2017-01-20 DIAGNOSIS — R079 Chest pain, unspecified: Secondary | ICD-10-CM

## 2017-01-20 DIAGNOSIS — J90 Pleural effusion, not elsewhere classified: Secondary | ICD-10-CM | POA: Insufficient documentation

## 2017-01-20 DIAGNOSIS — S2243XA Multiple fractures of ribs, bilateral, initial encounter for closed fracture: Secondary | ICD-10-CM | POA: Diagnosis not present

## 2017-01-20 DIAGNOSIS — F1290 Cannabis use, unspecified, uncomplicated: Secondary | ICD-10-CM | POA: Insufficient documentation

## 2017-01-20 DIAGNOSIS — F1110 Opioid abuse, uncomplicated: Secondary | ICD-10-CM | POA: Diagnosis not present

## 2017-01-20 DIAGNOSIS — R0602 Shortness of breath: Secondary | ICD-10-CM

## 2017-01-20 DIAGNOSIS — Z808 Family history of malignant neoplasm of other organs or systems: Secondary | ICD-10-CM | POA: Diagnosis not present

## 2017-01-20 DIAGNOSIS — F1410 Cocaine abuse, uncomplicated: Secondary | ICD-10-CM | POA: Diagnosis not present

## 2017-01-20 DIAGNOSIS — F1120 Opioid dependence, uncomplicated: Secondary | ICD-10-CM

## 2017-01-20 DIAGNOSIS — R000 Tachycardia, unspecified: Secondary | ICD-10-CM | POA: Diagnosis not present

## 2017-01-20 DIAGNOSIS — F17210 Nicotine dependence, cigarettes, uncomplicated: Secondary | ICD-10-CM | POA: Diagnosis not present

## 2017-01-20 DIAGNOSIS — J40 Bronchitis, not specified as acute or chronic: Secondary | ICD-10-CM | POA: Diagnosis not present

## 2017-01-20 DIAGNOSIS — R05 Cough: Secondary | ICD-10-CM

## 2017-01-20 HISTORY — DX: Other psychoactive substance use, unspecified, uncomplicated: F19.90

## 2017-01-20 HISTORY — DX: Anxiety disorder, unspecified: F41.9

## 2017-01-20 HISTORY — DX: Other psychoactive substance abuse, uncomplicated: F19.10

## 2017-01-20 LAB — TROPONIN I: Troponin I: <0.03 ng/mL (ref <0.03)

## 2017-01-20 LAB — RAPID URINE DRUG SCREEN, HOSP PERFORMED
Amphetamines: POSITIVE — AB
BARBITURATES: NOT DETECTED
BENZODIAZEPINES: NOT DETECTED
COCAINE: POSITIVE — AB
OPIATES: POSITIVE — AB
Tetrahydrocannabinol: POSITIVE — AB

## 2017-01-20 LAB — CBC WITH DIFFERENTIAL/PLATELET
Basophils Absolute: 0 10*3/uL (ref 0.0–0.1)
Basophils Relative: 0 %
EOS ABS: 0.1 10*3/uL (ref 0.0–0.7)
EOS PCT: 0 %
HCT: 34.6 % — ABNORMAL LOW (ref 36.0–46.0)
HEMOGLOBIN: 11.2 g/dL — AB (ref 12.0–15.0)
LYMPHS ABS: 0.8 10*3/uL (ref 0.7–4.0)
LYMPHS PCT: 6 %
MCH: 27.7 pg (ref 26.0–34.0)
MCHC: 32.4 g/dL (ref 30.0–36.0)
MCV: 85.6 fL (ref 78.0–100.0)
MONOS PCT: 5 %
Monocytes Absolute: 0.6 10*3/uL (ref 0.1–1.0)
Neutro Abs: 12.1 10*3/uL — ABNORMAL HIGH (ref 1.7–7.7)
Neutrophils Relative %: 89 %
PLATELETS: 339 10*3/uL (ref 150–400)
RBC: 4.04 MIL/uL (ref 3.87–5.11)
RDW: 18.3 % — ABNORMAL HIGH (ref 11.5–15.5)
WBC: 13.6 10*3/uL — ABNORMAL HIGH (ref 4.0–10.5)

## 2017-01-20 LAB — COMPREHENSIVE METABOLIC PANEL
ALK PHOS: 111 U/L (ref 38–126)
ALT: 40 U/L (ref 14–54)
ANION GAP: 8 (ref 5–15)
AST: 31 U/L (ref 15–41)
Albumin: 3.6 g/dL (ref 3.5–5.0)
BUN: 19 mg/dL (ref 6–20)
CALCIUM: 8.8 mg/dL — AB (ref 8.9–10.3)
CO2: 25 mmol/L (ref 22–32)
CREATININE: 1.17 mg/dL — AB (ref 0.44–1.00)
Chloride: 104 mmol/L (ref 101–111)
GFR, EST NON AFRICAN AMERICAN: 59 mL/min — AB (ref >60)
Glucose, Bld: 120 mg/dL — ABNORMAL HIGH (ref 65–99)
Potassium: 3.4 mmol/L — ABNORMAL LOW (ref 3.5–5.1)
SODIUM: 137 mmol/L (ref 135–145)
TOTAL PROTEIN: 6.7 g/dL (ref 6.5–8.1)
Total Bilirubin: 0.6 mg/dL (ref 0.3–1.2)

## 2017-01-20 LAB — URINALYSIS, ROUTINE W REFLEX MICROSCOPIC
Bilirubin Urine: NEGATIVE
GLUCOSE, UA: NEGATIVE mg/dL
Ketones, ur: NEGATIVE mg/dL
LEUKOCYTES UA: NEGATIVE
Nitrite: NEGATIVE
PH: 6 (ref 5.0–8.0)
PROTEIN: NEGATIVE mg/dL
Specific Gravity, Urine: 1.023 (ref 1.005–1.030)

## 2017-01-20 LAB — I-STAT CG4 LACTIC ACID, ED: Lactic Acid, Venous: 1.59 mmol/L (ref 0.5–1.9)

## 2017-01-20 LAB — LIPASE, BLOOD: Lipase: 30 U/L (ref 11–51)

## 2017-01-20 LAB — URINALYSIS, MICROSCOPIC (REFLEX): WBC UA: NONE SEEN WBC/hpf (ref 0–5)

## 2017-01-20 LAB — D-DIMER, QUANTITATIVE (NOT AT ARMC): D DIMER QUANT: 0.85 ug{FEU}/mL — AB (ref 0.00–0.50)

## 2017-01-20 LAB — PREGNANCY, URINE: Preg Test, Ur: NEGATIVE

## 2017-01-20 MED ORDER — NICOTINE 14 MG/24HR TD PT24
14.0000 mg | MEDICATED_PATCH | Freq: Once | TRANSDERMAL | Status: DC
  Filled 2017-01-20: qty 1

## 2017-01-20 MED ORDER — SODIUM CHLORIDE 0.9 % IV BOLUS (SEPSIS)
1000.0000 mL | Freq: Once | INTRAVENOUS | Status: AC
  Administered 2017-01-20: 1000 mL via INTRAVENOUS

## 2017-01-20 MED ORDER — NICOTINE 21 MG/24HR TD PT24
MEDICATED_PATCH | TRANSDERMAL | Status: AC
  Filled 2017-01-20: qty 1

## 2017-01-20 MED ORDER — ACETAMINOPHEN 325 MG PO TABS
650.0000 mg | ORAL_TABLET | Freq: Once | ORAL | Status: AC | PRN
  Administered 2017-01-20: 650 mg via ORAL
  Filled 2017-01-20: qty 2

## 2017-01-20 MED ORDER — VANCOMYCIN HCL IN DEXTROSE 1-5 GM/200ML-% IV SOLN
1000.0000 mg | Freq: Once | INTRAVENOUS | Status: AC
  Administered 2017-01-20: 1000 mg via INTRAVENOUS
  Filled 2017-01-20: qty 200

## 2017-01-20 MED ORDER — NICOTINE 21 MG/24HR TD PT24
21.0000 mg | MEDICATED_PATCH | Freq: Once | TRANSDERMAL | Status: AC
  Administered 2017-01-20: 21 mg via TRANSDERMAL

## 2017-01-20 MED ORDER — PIPERACILLIN-TAZOBACTAM 3.375 G IVPB
3.3750 g | Freq: Three times a day (TID) | INTRAVENOUS | Status: DC
Start: 2017-01-21 — End: 2017-01-22
  Administered 2017-01-21 – 2017-01-22 (×4): 3.375 g via INTRAVENOUS
  Filled 2017-01-20 (×5): qty 50

## 2017-01-20 MED ORDER — VANCOMYCIN HCL 500 MG IV SOLR
500.0000 mg | Freq: Two times a day (BID) | INTRAVENOUS | Status: DC
  Administered 2017-01-21: 500 mg via INTRAVENOUS
  Filled 2017-01-20 (×3): qty 500

## 2017-01-20 MED ORDER — IOPAMIDOL (ISOVUE-370) INJECTION 76%: 100.0000 mL | Freq: Once | INTRAVENOUS | Status: DC | PRN

## 2017-01-20 MED ORDER — LORAZEPAM 2 MG/ML IJ SOLN
1.0000 mg | Freq: Once | INTRAMUSCULAR | Status: AC
  Administered 2017-01-20: 1 mg via INTRAVENOUS
  Filled 2017-01-20: qty 1

## 2017-01-20 MED ORDER — PIPERACILLIN-TAZOBACTAM 3.375 G IVPB 30 MIN
3.3750 g | Freq: Once | INTRAVENOUS | Status: AC
Start: 2017-01-20 — End: 2017-01-20
  Administered 2017-01-20: 3.375 g via INTRAVENOUS
  Filled 2017-01-20 (×2): qty 50
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
## 2017-01-20 NOTE — ED Notes (Signed)
Patient given 2 glasses of water. Walked in to give patient water and she is at the end of the bed digging through the trash getting lighters out of the trash that her family member threw out  - family member was out of hte room x 1 minute with door shut.  Patient upset and states that he anxiety is high and needs her doctor to give her something.

## 2017-01-20 NOTE — ED Notes (Signed)
Patient transported to X-ray 

## 2017-01-20 NOTE — ED Notes (Signed)
4 lighters given to the family who was asked to take them out the car. The patient reports that they are her's but family reports that they are his.  - patient asked to take them out his car. Sam, Charge RN witnessed the conversation with the family. Family member also aware of the patients behavior when he is not in room and made aware to keep the door open when not in the room

## 2017-01-20 NOTE — ED Notes (Signed)
Patient is flipping around in the bed, changing positions frequently and states frequently that she is anxious. The patient made aware that we need to maintain her EKG monitor  - she states that she did not take her self off.

## 2017-01-20 NOTE — ED Notes (Signed)
Pt smoking in bathroom, advised pt of dangers of smoking in hospital, pt will have a bedside commode now

## 2017-01-20 NOTE — ED Notes (Signed)
Patient given 2 glasses of water, and patient refusing to drink them.

## 2017-01-20 NOTE — ED Notes (Signed)
Pt was given a cup to attempt a urine sample. Pt came back to room and stated "I need more water, I can't pee right now."

## 2017-01-20 NOTE — ED Notes (Signed)
EMT asked pt for urine sample. Pt states "I can't make urine come out when I'm dehydrated. No one will give me something to drink. I can't give urine." EMT asked pt to try and pt stated "no, I can't make urine come out." RN notified.
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
## 2017-01-20 NOTE — ED Notes (Signed)
EMT assisted pt to bathroom. Pt in bathroom for long time. EMT went into bathroom to find pt washing her hands and a lighter on the sink. Bathroom is noted to smell of smoke. Pt denies smoking.

## 2017-01-20 NOTE — ED Notes (Signed)
EMT walks into room to find pt going through bags once again. EMT tells pt she needs to stay in the bed and hooked up to the monitor. Pt asks EMT to heat up her hot pocket she brought herself. EMT heats up food and gives it to the pt and again states that she needs to stay in the bed and hooked up to the monitor. RN notified.

## 2017-01-20 NOTE — ED Notes (Signed)
EMT assisted pt to bathroom to obtain sample. Pt noted to be bringing a small bag with her to the bathroom. Pt also noted to be in the bathroom for a long time. RN notified.
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
## 2017-01-20 NOTE — ED Triage Notes (Addendum)
Comes to triage carrying a heavy back pain. States she walked an hour to get here in the heat. Her hair is wet and her skin is wet. She looks sweaty. Wearing shorts and a tank top. States she has rib fractures and may have re fractured her ribs today. States she has a fever. She is texting at triage, very talkative. States she feels someone is plotting against her to blame her for something. Request police not be notified and her name not being given out to anyone calling to see if she is here.

## 2017-01-20 NOTE — ED Notes (Signed)
Patient and fmaily member warned that if they continue to argue then the family member will be asked to leave. Patient and family member can be heard into the next room and in the hallway. ED charge and Security aware

## 2017-01-20 NOTE — Progress Notes (Signed)
Pharmacy Antibiotic Note  Eileen [name] is a 36 y.o. female admitted on 01/20/2017 with sepsis.  Pharmacy has been consulted for vancomycin and Zosyn dosing. Tmax 101.9, WBC elevated at 13.6, CrCl ~ 57 ml/min  Plan: Vancomycin 1000 mg x1 then 500 mg IV every 12 hours.  Goal trough 15-20 mcg/mL. Zosyn 3.375g IV q8h (4 hour infusion). Monitor renal function, cultures, ability to de-escalate  Height: 5\' 4"  (162.6 cm) Weight: 135 lb (61.2 kg) IBW/kg (Calculated) : 54.7  Temp (24hrs), Avg:101.2 F (38.4 C), Min:100.5 F (38.1 C), Max:101.9 F (38.8 C)   Recent Labs Lab 01/20/17 1817 01/20/17 1818  WBC 13.6*  --   CREATININE 1.17*  --   LATICACIDVEN  --  1.59    Estimated Creatinine Clearance: 56.8 mL/min (A) (by C-G formula based on SCr of 1.17 mg/dL (H)).    No Known Allergies  Antimicrobials this admission: 5/3 vanc >>  55/3 Zosyn >>   Dose adjustments this admission: n/a  Microbiology results: 5/3 BCx: sent 5/3 UCx: sent  Thank you for allowing pharmacy to be a part of this patient's care.  Eileen ChildRenee Eileen [name] 01/20/2017 9:16 PM

## 2017-01-20 NOTE — ED Notes (Signed)
Family member to that bedside with a Dr. Mirian CapuchinAttempting to start IV's and complete orders. The patient states that she the DR. Pepper. The patient to wait until I was finished. She proceeded to sit up and grab the Dr. Reino KentPepper out of her family's hand and yell "You don't want to cross me right now, Im pissed off and I so dehydrated from walking here over the last hour"   Patient and family then proceeded to yell at each other for a few minutes, then started kissing at the bedside because they were "sorry"  - Patient then cried until this RN left the room. Intermittently she would grab her left ribs and state "they hurt so bad"

## 2017-01-21 ENCOUNTER — Encounter (HOSPITAL_COMMUNITY): Payer: Self-pay | Admitting: Internal Medicine

## 2017-01-21 DIAGNOSIS — R0602 Shortness of breath: Secondary | ICD-10-CM

## 2017-01-21 DIAGNOSIS — F0630 Mood disorder due to known physiological condition, unspecified: Secondary | ICD-10-CM

## 2017-01-21 DIAGNOSIS — R079 Chest pain, unspecified: Secondary | ICD-10-CM

## 2017-01-21 DIAGNOSIS — F17210 Nicotine dependence, cigarettes, uncomplicated: Secondary | ICD-10-CM

## 2017-01-21 DIAGNOSIS — F1920 Other psychoactive substance dependence, uncomplicated: Secondary | ICD-10-CM

## 2017-01-21 LAB — CBC
HCT: 29.6 % — ABNORMAL LOW (ref 36.0–46.0)
Hemoglobin: 9.6 g/dL — ABNORMAL LOW (ref 12.0–15.0)
MCH: 27.7 pg (ref 26.0–34.0)
MCHC: 32.4 g/dL (ref 30.0–36.0)
MCV: 85.5 fL (ref 78.0–100.0)
Platelets: 266 10*3/uL (ref 150–400)
RBC: 3.46 MIL/uL — ABNORMAL LOW (ref 3.87–5.11)
RDW: 18.7 % — AB (ref 11.5–15.5)
WBC: 12.1 10*3/uL — AB (ref 4.0–10.5)

## 2017-01-21 LAB — BASIC METABOLIC PANEL
ANION GAP: 5 (ref 5–15)
BUN: 13 mg/dL (ref 6–20)
CALCIUM: 7.5 mg/dL — AB (ref 8.9–10.3)
CO2: 24 mmol/L (ref 22–32)
Chloride: 109 mmol/L (ref 101–111)
Creatinine, Ser: 0.84 mg/dL (ref 0.44–1.00)
Glucose, Bld: 149 mg/dL — ABNORMAL HIGH (ref 65–99)
Potassium: 3.4 mmol/L — ABNORMAL LOW (ref 3.5–5.1)
SODIUM: 138 mmol/L (ref 135–145)

## 2017-01-21 LAB — HIV ANTIBODY (ROUTINE TESTING W REFLEX): HIV SCREEN 4TH GENERATION: NONREACTIVE

## 2017-01-21 LAB — MRSA PCR SCREENING: MRSA by PCR: NEGATIVE

## 2017-01-21 MED ORDER — ENOXAPARIN SODIUM 40 MG/0.4ML ~~LOC~~ SOLN
40.0000 mg | SUBCUTANEOUS | Status: DC
  Administered 2017-01-21: 40 mg via SUBCUTANEOUS
  Filled 2017-01-21: qty 0.4

## 2017-01-21 MED ORDER — LORAZEPAM 2 MG/ML IJ SOLN
2.0000 mg | Freq: Once | INTRAMUSCULAR | Status: AC
  Administered 2017-01-21: 2 mg via INTRAVENOUS
  Filled 2017-01-21: qty 1

## 2017-01-21 MED ORDER — ACETAMINOPHEN 650 MG RE SUPP: 650.0000 mg | Freq: Four times a day (QID) | RECTAL | Status: DC | PRN

## 2017-01-21 MED ORDER — KETOROLAC TROMETHAMINE 15 MG/ML IJ SOLN
15.0000 mg | Freq: Four times a day (QID) | INTRAMUSCULAR | Status: DC | PRN
  Administered 2017-01-21 – 2017-01-22 (×4): 15 mg via INTRAVENOUS
  Filled 2017-01-21 (×4): qty 1

## 2017-01-21 MED ORDER — VANCOMYCIN HCL IN DEXTROSE 750-5 MG/150ML-% IV SOLN
750.0000 mg | Freq: Two times a day (BID) | INTRAVENOUS | Status: DC
  Administered 2017-01-21: 750 mg via INTRAVENOUS
  Filled 2017-01-21 (×3): qty 150

## 2017-01-21 MED ORDER — NICOTINE 21 MG/24HR TD PT24
21.0000 mg | MEDICATED_PATCH | Freq: Every day | TRANSDERMAL | Status: DC
  Administered 2017-01-21 (×2): 21 mg via TRANSDERMAL
  Filled 2017-01-21 (×2): qty 1

## 2017-01-21 MED ORDER — LORAZEPAM 0.5 MG PO TABS
0.5000 mg | ORAL_TABLET | Freq: Four times a day (QID) | ORAL | Status: DC | PRN
  Administered 2017-01-21 – 2017-01-22 (×2): 1 mg via ORAL
  Filled 2017-01-21 (×2): qty 2

## 2017-01-21 MED ORDER — LORAZEPAM 2 MG/ML IJ SOLN
1.0000 mg | Freq: Once | INTRAMUSCULAR | Status: AC
  Administered 2017-01-21: 1 mg via INTRAVENOUS
  Filled 2017-01-21: qty 1

## 2017-01-21 MED ORDER — CLONIDINE HCL 0.1 MG PO TABS: 0.1000 mg | ORAL_TABLET | Freq: Three times a day (TID) | ORAL | Status: DC | PRN

## 2017-01-21 MED ORDER — ACETAMINOPHEN 325 MG PO TABS
650.0000 mg | ORAL_TABLET | Freq: Four times a day (QID) | ORAL | Status: DC | PRN
  Administered 2017-01-21: 650 mg via ORAL
  Filled 2017-01-21: qty 2

## 2017-01-21 NOTE — Progress Notes (Signed)
Pharmacy Antibiotic Note  Eileen AddisonCrystal [name] is a 36 y.o. female admitted on 01/20/2017 with sepsis.  Pharmacy has been consulted for vancomycin and Zosyn dosing. Tmax 101.9, but also hypothermic to 96.3 x 1.  WBC 13.6>12.1, Cret 1.17>0.84. Heat CT neg, CXR neg. MRSA PCR neg.   Plan: Increase vancomycin to 750 mg IV q12h.  Goal trough 15-20 mcg/mL. Zosyn 3.375g IV q8h (4 hour infusion). Monitor renal function, cultures, ability to de-escalate Vancomycin level as needed  Height: 5\' 4"  (162.6 cm) Weight: 141 lb 8.6 oz (64.2 kg) IBW/kg (Calculated) : 54.7  Temp (24hrs), Avg:98.9 F (37.2 C), Min:96.3 F (35.7 C), Max:101.9 F (38.8 C)   Recent Labs Lab 01/20/17 1817 01/20/17 1818 01/21/17 0318  WBC 13.6*  --  12.1*  CREATININE 1.17*  --  0.84  LATICACIDVEN  --  1.59  --     Estimated Creatinine Clearance: 79.2 mL/min (by C-G formula based on SCr of 0.84 mg/dL).    No Known Allergies  Antimicrobials this admission: 5/3 vanc >>  55/3 Zosyn >>   Dose adjustments this admission: 5/4 increase vanc dose  Microbiology results: 5/3 BCx: sent 5/3 UCx: sent 5/3 MRSA PCR neg  Thank you for allowing pharmacy to be a part of this patient's care.  Herby AbrahamMichelle T. Jaliel Deavers, Pharm.D. [phone] 01/21/2017 1:28 PM
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
## 2017-01-21 NOTE — Care Management Note (Signed)
Case Management Note  Patient Details  Name: Eileen AddisonCrystal [name] MRN: [id number] Date of Birth: [birth year]  Subjective/Objective:        Sepsis in patient with known ivdu            Action/Plan: [id number]/Chancey Cullinane Earlene Plateravis, BSN, RN3, [phone]/[phone] Chart review for patient progression. Chart review for case management needs: Next review due on [id number].  Expected Discharge Date:                  Expected Discharge Plan:  Home/Self Care  In-House Referral:     Discharge planning Services     Post Acute Care Choice:    Choice offered to:     DME Arranged:    DME Agency:     HH Arranged:    HH Agency:     Status of Service:  In process, will continue to follow  If discussed at Long Length of Stay Meetings, dates discussed:    Additional Comments:  Golda AcreDavis, Adeline Petitfrere Lynn, RN 01/21/2017, 9:32 AM

## 2017-01-21 NOTE — Progress Notes (Signed)
Patient seen and examined at bedside, patient admitted after midnight, please see earlier detailed admission note by Michael LitterNikki Carter, MD. Briefly, patient presented with sepsis secondary to possible pneumonia. Sepsis physiology resolved. Blood cultures pending. Transfer out of stepdown today. Psychiatry consult.  Jacquelin Hawkingalph Nettey, MD [facility] 01/21/2017, 1:56 PM Pager: [phone]
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
## 2017-01-22 ENCOUNTER — Encounter (HOSPITAL_COMMUNITY): Payer: Self-pay

## 2017-01-22 ENCOUNTER — Observation Stay (HOSPITAL_BASED_OUTPATIENT_CLINIC_OR_DEPARTMENT_OTHER)
Admission: EM | Admit: 2017-01-22 | Discharge: 2017-01-23 | Discharge status: Against Medical Advice | Payer: Medicaid Other | Source: Home / Self Care | Attending: Emergency Medicine | Admitting: Emergency Medicine

## 2017-01-22 ENCOUNTER — Emergency Department (HOSPITAL_COMMUNITY): Payer: Medicaid Other

## 2017-01-22 DIAGNOSIS — F29 Unspecified psychosis not due to a substance or known physiological condition: Secondary | ICD-10-CM

## 2017-01-22 DIAGNOSIS — Z79899 Other long term (current) drug therapy: Secondary | ICD-10-CM

## 2017-01-22 DIAGNOSIS — J90 Pleural effusion, not elsewhere classified: Secondary | ICD-10-CM

## 2017-01-22 DIAGNOSIS — F419 Anxiety disorder, unspecified: Secondary | ICD-10-CM | POA: Insufficient documentation

## 2017-01-22 DIAGNOSIS — Y9355 Activity, bike riding: Secondary | ICD-10-CM

## 2017-01-22 DIAGNOSIS — F0630 Mood disorder due to known physiological condition, unspecified: Secondary | ICD-10-CM

## 2017-01-22 DIAGNOSIS — W19XXXA Unspecified fall, initial encounter: Secondary | ICD-10-CM | POA: Insufficient documentation

## 2017-01-22 DIAGNOSIS — R079 Chest pain, unspecified: Secondary | ICD-10-CM

## 2017-01-22 DIAGNOSIS — F1990 Other psychoactive substance use, unspecified, uncomplicated: Secondary | ICD-10-CM

## 2017-01-22 DIAGNOSIS — R05 Cough: Secondary | ICD-10-CM

## 2017-01-22 DIAGNOSIS — E041 Nontoxic single thyroid nodule: Secondary | ICD-10-CM | POA: Insufficient documentation

## 2017-01-22 DIAGNOSIS — R6510 Systemic inflammatory response syndrome (SIRS) of non-infectious origin without acute organ dysfunction: Secondary | ICD-10-CM | POA: Insufficient documentation

## 2017-01-22 DIAGNOSIS — F17210 Nicotine dependence, cigarettes, uncomplicated: Secondary | ICD-10-CM | POA: Insufficient documentation

## 2017-01-22 DIAGNOSIS — S2243XA Multiple fractures of ribs, bilateral, initial encounter for closed fracture: Secondary | ICD-10-CM | POA: Insufficient documentation

## 2017-01-22 DIAGNOSIS — F1110 Opioid abuse, uncomplicated: Secondary | ICD-10-CM | POA: Insufficient documentation

## 2017-01-22 DIAGNOSIS — F1410 Cocaine abuse, uncomplicated: Secondary | ICD-10-CM | POA: Insufficient documentation

## 2017-01-22 DIAGNOSIS — F1510 Other stimulant abuse, uncomplicated: Secondary | ICD-10-CM | POA: Insufficient documentation

## 2017-01-22 DIAGNOSIS — R0602 Shortness of breath: Secondary | ICD-10-CM

## 2017-01-22 DIAGNOSIS — Z808 Family history of malignant neoplasm of other organs or systems: Secondary | ICD-10-CM | POA: Insufficient documentation

## 2017-01-22 DIAGNOSIS — R509 Fever, unspecified: Secondary | ICD-10-CM

## 2017-01-22 LAB — CBC WITH DIFFERENTIAL/PLATELET
BASOS ABS: 0 10*3/uL (ref 0.0–0.1)
Basophils Relative: 0 %
Eosinophils Absolute: 0.5 10*3/uL (ref 0.0–0.7)
Eosinophils Relative: 4 %
HEMATOCRIT: 32.8 % — AB (ref 36.0–46.0)
HEMOGLOBIN: 10.8 g/dL — AB (ref 12.0–15.0)
LYMPHS ABS: 3.4 10*3/uL (ref 0.7–4.0)
Lymphocytes Relative: 27 %
MCH: 28 pg (ref 26.0–34.0)
MCHC: 32.9 g/dL (ref 30.0–36.0)
MCV: 85 fL (ref 78.0–100.0)
MONO ABS: 0.8 10*3/uL (ref 0.1–1.0)
MONOS PCT: 7 %
NEUTROS ABS: 7.7 10*3/uL (ref 1.7–7.7)
Neutrophils Relative %: 62 %
Platelets: 338 10*3/uL (ref 150–400)
RBC: 3.86 MIL/uL — ABNORMAL LOW (ref 3.87–5.11)
RDW: 18.3 % — ABNORMAL HIGH (ref 11.5–15.5)
WBC: 12.5 10*3/uL — ABNORMAL HIGH (ref 4.0–10.5)

## 2017-01-22 LAB — URINALYSIS, ROUTINE W REFLEX MICROSCOPIC
BACTERIA UA: NONE SEEN
BILIRUBIN URINE: NEGATIVE
Glucose, UA: NEGATIVE mg/dL
Ketones, ur: NEGATIVE mg/dL
LEUKOCYTES UA: NEGATIVE
NITRITE: NEGATIVE
Protein, ur: NEGATIVE mg/dL
Specific Gravity, Urine: 1.008 (ref 1.005–1.030)
WBC UA: NONE SEEN WBC/hpf (ref 0–5)
pH: 5 (ref 5.0–8.0)

## 2017-01-22 LAB — RAPID URINE DRUG SCREEN, HOSP PERFORMED
AMPHETAMINES: POSITIVE — AB
Barbiturates: NOT DETECTED
Benzodiazepines: NOT DETECTED
COCAINE: NOT DETECTED
OPIATES: POSITIVE — AB
Tetrahydrocannabinol: NOT DETECTED

## 2017-01-22 LAB — COMPREHENSIVE METABOLIC PANEL
ALBUMIN: 3.2 g/dL — AB (ref 3.5–5.0)
ALK PHOS: 95 U/L (ref 38–126)
ALT: 41 U/L (ref 14–54)
ANION GAP: 5 (ref 5–15)
AST: 27 U/L (ref 15–41)
BUN: 9 mg/dL (ref 6–20)
CALCIUM: 8.8 mg/dL — AB (ref 8.9–10.3)
CO2: 25 mmol/L (ref 22–32)
Chloride: 107 mmol/L (ref 101–111)
Creatinine, Ser: 0.81 mg/dL (ref 0.44–1.00)
GFR calc Af Amer: >60 mL/min (ref >60)
GFR calc non Af Amer: >60 mL/min (ref >60)
GLUCOSE: 119 mg/dL — AB (ref 65–99)
Potassium: 3.8 mmol/L (ref 3.5–5.1)
SODIUM: 137 mmol/L (ref 135–145)
Total Bilirubin: 0.2 mg/dL — ABNORMAL LOW (ref 0.3–1.2)
Total Protein: 6 g/dL — ABNORMAL LOW (ref 6.5–8.1)

## 2017-01-22 LAB — URINE CULTURE: Culture: <10000 — AB

## 2017-01-22 LAB — ETHANOL: Alcohol, Ethyl (B): <5 mg/dL (ref <5)

## 2017-01-22 LAB — I-STAT CG4 LACTIC ACID, ED: Lactic Acid, Venous: 1.27 mmol/L (ref 0.5–1.9)

## 2017-01-22 MED ORDER — ENOXAPARIN SODIUM 40 MG/0.4ML ~~LOC~~ SOLN: 40.0000 mg | SUBCUTANEOUS | Status: DC

## 2017-01-22 MED ORDER — KETOROLAC TROMETHAMINE 30 MG/ML IJ SOLN
30.0000 mg | Freq: Once | INTRAMUSCULAR | Status: AC
  Administered 2017-01-22: 30 mg via INTRAVENOUS
  Filled 2017-01-22: qty 1

## 2017-01-22 MED ORDER — NICOTINE 21 MG/24HR TD PT24
21.0000 mg | MEDICATED_PATCH | Freq: Once | TRANSDERMAL | Status: DC
  Administered 2017-01-22: 21 mg via TRANSDERMAL
  Filled 2017-01-22: qty 1

## 2017-01-22 MED ORDER — PIPERACILLIN-TAZOBACTAM 3.375 G IVPB
3.3750 g | Freq: Three times a day (TID) | INTRAVENOUS | Status: DC
  Filled 2017-01-22 (×2): qty 50

## 2017-01-22 MED ORDER — LORAZEPAM 1 MG PO TABS: 1.0000 mg | ORAL_TABLET | Freq: Four times a day (QID) | ORAL | Status: DC | PRN

## 2017-01-22 MED ORDER — THIAMINE HCL 100 MG/ML IJ SOLN: 100.0000 mg | Freq: Every day | INTRAMUSCULAR | Status: DC

## 2017-01-22 MED ORDER — VITAMIN B-1 100 MG PO TABS
100.0000 mg | ORAL_TABLET | Freq: Every day | ORAL | Status: DC
  Administered 2017-01-22: 100 mg via ORAL
  Filled 2017-01-22: qty 1

## 2017-01-22 MED ORDER — LORAZEPAM 2 MG/ML IJ SOLN
1.0000 mg | Freq: Four times a day (QID) | INTRAMUSCULAR | Status: DC | PRN
  Administered 2017-01-22: 1 mg via INTRAVENOUS
  Filled 2017-01-22: qty 1

## 2017-01-22 MED ORDER — BUPRENORPHINE HCL-NALOXONE HCL 8-2 MG SL SUBL
1.0000 | SUBLINGUAL_TABLET | Freq: Every day | SUBLINGUAL | Status: DC
  Administered 2017-01-22: 1 via SUBLINGUAL
  Filled 2017-01-22: qty 1

## 2017-01-22 MED ORDER — VANCOMYCIN HCL IN DEXTROSE 1-5 GM/200ML-% IV SOLN
1000.0000 mg | Freq: Once | INTRAVENOUS | Status: AC
Start: 2017-01-22 — End: 2017-01-22
  Administered 2017-01-22: 1000 mg via INTRAVENOUS
  Filled 2017-01-22: qty 200

## 2017-01-22 MED ORDER — VANCOMYCIN HCL IN DEXTROSE 750-5 MG/150ML-% IV SOLN
750.0000 mg | Freq: Two times a day (BID) | INTRAVENOUS | Status: DC
  Filled 2017-01-22: qty 150

## 2017-01-22 MED ORDER — IOPAMIDOL (ISOVUE-370) INJECTION 76%
INTRAVENOUS | Status: AC
  Administered 2017-01-22: 100 mL via INTRAVENOUS
  Filled 2017-01-22: qty 100

## 2017-01-22 MED ORDER — ACETAMINOPHEN 325 MG PO TABS: 650.0000 mg | ORAL_TABLET | Freq: Four times a day (QID) | ORAL | Status: DC | PRN

## 2017-01-22 MED ORDER — ONDANSETRON HCL 4 MG PO TABS: 4.0000 mg | ORAL_TABLET | Freq: Four times a day (QID) | ORAL | Status: DC | PRN

## 2017-01-22 MED ORDER — OXYCODONE-ACETAMINOPHEN 5-325 MG PO TABS
2.0000 | ORAL_TABLET | Freq: Once | ORAL | Status: AC
  Administered 2017-01-22: 2 via ORAL
  Filled 2017-01-22: qty 2

## 2017-01-22 MED ORDER — FOLIC ACID 1 MG PO TABS
1.0000 mg | ORAL_TABLET | Freq: Every day | ORAL | Status: DC
  Administered 2017-01-22: 1 mg via ORAL
  Filled 2017-01-22: qty 1

## 2017-01-22 MED ORDER — ADULT MULTIVITAMIN W/MINERALS CH
1.0000 | ORAL_TABLET | Freq: Every day | ORAL | Status: DC
  Administered 2017-01-22: 1 via ORAL
  Filled 2017-01-22: qty 1

## 2017-01-22 MED ORDER — ONDANSETRON HCL 4 MG/2ML IJ SOLN: 4.0000 mg | Freq: Four times a day (QID) | INTRAMUSCULAR | Status: DC | PRN

## 2017-01-22 MED ORDER — THIAMINE HCL 100 MG/ML IJ SOLN
Freq: Once | INTRAVENOUS | Status: DC
  Filled 2017-01-22: qty 1000

## 2017-01-22 MED ORDER — ACETAMINOPHEN 650 MG RE SUPP: 650.0000 mg | Freq: Four times a day (QID) | RECTAL | Status: DC | PRN

## 2017-01-22 MED ORDER — PIPERACILLIN-TAZOBACTAM 3.375 G IVPB 30 MIN
3.3750 g | Freq: Once | INTRAVENOUS | Status: AC
  Administered 2017-01-22: 3.375 g via INTRAVENOUS
  Filled 2017-01-22: qty 50

## 2017-01-22 MED ORDER — NICOTINE 21 MG/24HR TD PT24
21.0000 mg | MEDICATED_PATCH | Freq: Every day | TRANSDERMAL | Status: DC
  Filled 2017-01-22: qty 1

## 2017-01-22 NOTE — Progress Notes (Signed)
Pharmacy Antibiotic Note  Eileen AddisonCrystal [name] is a 36 y.o. female admitted on 01/22/2017 with pneumonia and sepsis.  Pharmacy has been consulted for Vanc/Zosyn dosing.  Plan: Vancomycin 750mg  IV every 12 hours.  Goal trough 15-20 mcg/mL. Zosyn 3.375g IV q8h (4 hour infusion).     Temp (24hrs), Avg:98 F (36.7 C), Min:97.6 F (36.4 C), Max:98.3 F (36.8 C)   Recent Labs Lab 01/20/17 1817 01/20/17 1818 01/21/17 0318 01/22/17 1405 01/22/17 1416  WBC 13.6*  --  12.1* 12.5*  --   CREATININE 1.17*  --  0.84 0.81  --   LATICACIDVEN  --  1.59  --   --  1.27    Estimated Creatinine Clearance: 82.1 mL/min (by C-G formula based on SCr of 0.81 mg/dL).    No Known Allergies    Thank you for allowing pharmacy to be a part of this patient's care.   Hessie KnowsJustin M Nikolis Berent, PharmD, BCPS Pager [phone] 01/22/2017 2:56 PM
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
## 2017-01-22 NOTE — ED Notes (Signed)
ED Provider at bedside. 

## 2017-01-22 NOTE — ED Triage Notes (Addendum)
She states "I am septic and I have broke ribs" [sic]. She is very drowsy, as if she took a sleep-inducing medication or substance. She has a "boyfriend with her who is also hypervigilant and acting strangely. They further tell me that she left here as an inpatient earlier today AMA.

## 2017-01-22 NOTE — Progress Notes (Signed)
Pt became upset after boyfriend left. Patient was attempting to pull out IV and pulled off tele box. After repeated threats to leave, the patient signed AMA paper. All belongings were given to the patient. Patient could not locate her phone. Room was search by multiple personnel without locating. Patient was escorted to the emergency room to obtain her knives from security.
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
## 2017-01-22 NOTE — ED Notes (Signed)
Pt unable to give urine spec
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
## 2017-01-23 ENCOUNTER — Emergency Department (HOSPITAL_COMMUNITY): Payer: Medicaid Other

## 2017-01-23 ENCOUNTER — Emergency Department (HOSPITAL_COMMUNITY)
Admission: EM | Admit: 2017-01-23 | Discharge: 2017-01-23 | Disposition: A | Discharge status: Home / Self Care | Payer: Medicaid Other | Attending: Emergency Medicine | Admitting: Emergency Medicine

## 2017-01-23 ENCOUNTER — Encounter (HOSPITAL_COMMUNITY): Payer: Self-pay | Admitting: Emergency Medicine

## 2017-01-23 DIAGNOSIS — R0602 Shortness of breath: Secondary | ICD-10-CM | POA: Diagnosis present

## 2017-01-23 DIAGNOSIS — R6510 Systemic inflammatory response syndrome (SIRS) of non-infectious origin without acute organ dysfunction: Secondary | ICD-10-CM

## 2017-01-23 DIAGNOSIS — R071 Chest pain on breathing: Secondary | ICD-10-CM

## 2017-01-23 DIAGNOSIS — J40 Bronchitis, not specified as acute or chronic: Secondary | ICD-10-CM | POA: Diagnosis not present

## 2017-01-23 DIAGNOSIS — F17210 Nicotine dependence, cigarettes, uncomplicated: Secondary | ICD-10-CM | POA: Diagnosis not present

## 2017-01-23 LAB — COMPREHENSIVE METABOLIC PANEL
ALBUMIN: 3.3 g/dL — AB (ref 3.5–5.0)
ALT: 33 U/L (ref 14–54)
ANION GAP: 6 (ref 5–15)
AST: 22 U/L (ref 15–41)
Alkaline Phosphatase: 96 U/L (ref 38–126)
BILIRUBIN TOTAL: 0.4 mg/dL (ref 0.3–1.2)
BUN: 8 mg/dL (ref 6–20)
CALCIUM: 8.8 mg/dL — AB (ref 8.9–10.3)
CO2: 25 mmol/L (ref 22–32)
Chloride: 106 mmol/L (ref 101–111)
Creatinine, Ser: 0.85 mg/dL (ref 0.44–1.00)
GFR calc non Af Amer: >60 mL/min (ref >60)
Glucose, Bld: 103 mg/dL — ABNORMAL HIGH (ref 65–99)
POTASSIUM: 4 mmol/L (ref 3.5–5.1)
SODIUM: 137 mmol/L (ref 135–145)
TOTAL PROTEIN: 6.7 g/dL (ref 6.5–8.1)

## 2017-01-23 LAB — TROPONIN I

## 2017-01-23 LAB — CBC
HCT: 34.5 % — ABNORMAL LOW (ref 36.0–46.0)
Hemoglobin: 11.3 g/dL — ABNORMAL LOW (ref 12.0–15.0)
MCH: 27.7 pg (ref 26.0–34.0)
MCHC: 32.8 g/dL (ref 30.0–36.0)
MCV: 84.6 fL (ref 78.0–100.0)
Platelets: 353 10*3/uL (ref 150–400)
RBC: 4.08 MIL/uL (ref 3.87–5.11)
RDW: 18.2 % — AB (ref 11.5–15.5)
WBC: 12 10*3/uL — ABNORMAL HIGH (ref 4.0–10.5)

## 2017-01-23 LAB — LACTIC ACID, PLASMA: Lactic Acid, Venous: 1.1 mmol/L (ref 0.5–1.9)

## 2017-01-23 MED ORDER — LEVOFLOXACIN 500 MG PO TABS
500.0000 mg | ORAL_TABLET | Freq: Once | ORAL | Status: AC
  Administered 2017-01-23: 500 mg via ORAL
  Filled 2017-01-23: qty 1

## 2017-01-23 MED ORDER — LEVOFLOXACIN 500 MG PO TABS: 500.0000 mg | ORAL_TABLET | Freq: Every day | ORAL | 0 refills | Status: DC

## 2017-01-23 NOTE — ED Notes (Signed)
First set of cultures sent to the lab. 

## 2017-01-23 NOTE — ED Notes (Signed)
Patient instructed on use of incentive spirometer. Teach back method used. 
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
## 2017-01-23 NOTE — ED Triage Notes (Addendum)
Pt reports worsening SOB and generalized body edema since leaving AMA last night. Pt sleepy in triage; denies using heroin today.

## 2017-01-23 NOTE — ED Notes (Signed)
ED Provider at bedside. 

## 2017-01-23 NOTE — Discharge Summary (Deleted)
  The note originally documented on this encounter has been moved the the encounter in which it belongs.  

## 2017-01-23 NOTE — ED Provider Notes (Signed)
[facility]-EMERGENCY DEPT Provider Note   CSN: [id number] Arrival date & time: 01/23/17  1246     History   Chief Complaint Chief Complaint  Patient presents with  . Shortness of Breath  . Chest Pain    HPI Eileen [name] is a 36 y.o. female.  HPI Patient presents to the emergency department with complaints of ongoing shortness of breath and pain in her chest with deep breathing since leaving the hospital AGAINST MEDICAL ADVICE.  She's been admitted to the hospital twice in the past week both times she has left AMA.  She returns now requesting additional assistance.  She has a known history of heroin abuse reports ongoing use.  She denies fevers and chills at this time.  She was recently hospitalized for fevers and questionable pneumonia.  CT of her chest performed yesterday demonstrates atelectasis without obvious pneumonia and negative for pulmonary embolism.  Blood cultures been negative thus far.  Patient denies abdominal pain.  Reports no nausea or vomiting.  Denies diarrhea.  Patient reports no use of heroin today.  She reports generalized swelling of her body.   Past Medical History:  Diagnosis Date  . Anxiety   . IVDU (intravenous drug user)   . Polysubstance abuse     Patient Active Problem List   Diagnosis Date Noted  . SIRS (systemic inflammatory response syndrome) (HCC) 01/22/2017  . Sepsis (HCC) 01/20/2017  . Mood disorder in conditions classified elsewhere   . Polysubstance dependence including opioid drug with daily use (HCC) 09/09/2016    History reviewed. No pertinent surgical history.  OB History    No data available       Home Medications    Prior to Admission medications   Medication Sig Start Date End Date Taking? Authorizing Provider  buprenorphine-naloxone (SUBOXONE) 8-2 mg SUBL SL tablet Place 1 tablet under the tongue daily.   Yes [provider]  levofloxacin (LEVAQUIN) 500 MG tablet Take 1 tablet (500 mg total) by mouth daily. 01/23/17    Azalia Bilis, MD    Family History Family History  Problem Relation Age of Onset  . Brain cancer Father   . Brain cancer Brother     Social History Social History  Substance Use Topics  . Smoking status: Current Every Day Smoker    Packs/day: 1.00    Types: Cigarettes  . Smokeless tobacco: Never Used  . Alcohol use Yes     Comment: BAC not available at time of writing     Allergies   Patient has no known allergies.   Review of Systems Review of Systems  All other systems reviewed and are negative.    Physical Exam Updated Vital Signs BP 125/84   Pulse 90   Temp 98.5 F (36.9 C) (Oral)   Resp 15   LMP 01/18/2017   SpO2 97%   Physical Exam  Constitutional: She is oriented to person, place, and time. She appears well-developed and well-nourished. No distress.  HENT:  Head: Normocephalic and atraumatic.  Eyes: EOM are normal.  Neck: Normal range of motion.  Cardiovascular: Normal rate, regular rhythm and normal heart sounds.   No murmur heard. Pulmonary/Chest: Effort normal and breath sounds normal.  Abdominal: Soft. She exhibits no distension. There is no tenderness.  Musculoskeletal: Normal range of motion.  Neurological: She is alert and oriented to person, place, and time.  Skin: Skin is warm and dry.  Psychiatric: She has a normal mood and affect. Judgment normal.  Nursing note and vitals  reviewed.    ED Treatments / Results  Labs (all labs ordered are listed, but only abnormal results are displayed) Labs Reviewed  CBC - Abnormal; Notable for the following:       Result Value   WBC 12.0 (*)    Hemoglobin 11.3 (*)    HCT 34.5 (*)    RDW 18.2 (*)    All other components within normal limits  COMPREHENSIVE METABOLIC PANEL - Abnormal; Notable for the following:    Glucose, Bld 103 (*)    Calcium 8.8 (*)    Albumin 3.3 (*)    All other components within normal limits  CULTURE, BLOOD (ROUTINE X 2)  CULTURE, BLOOD (ROUTINE X 2)  TROPONIN I    LACTIC ACID, PLASMA    EKG  EKG Interpretation None       Radiology Dg Chest 2 View  Result Date: 01/23/2017 CLINICAL DATA:  Worsening shortness of breath with generalized body edema since leading hospital AMA last night. History of IV drug use. EXAM: CHEST  2 VIEW COMPARISON:  CT 01/22/2017.  Radiographs 01/20/2017. FINDINGS: There are lower lung volumes with worsening bibasilar airspace opacities from the radiographs of 3 days ago, probably worsening atelectasis based on interval CT. No pneumothorax or significant pleural effusion identified. The heart size and mediastinal contours are stable. Known left-sided rib fractures are not well visualized. IMPRESSION: Worsening bibasilar atelectasis. No pneumothorax or significant pleural effusion. Electronically Signed   By: Carey BullocksWilliam  Veazey M.D.   On: 01/23/2017 14:12   Ct Angio Chest Pe W/cm &/or Wo Cm  Result Date: 01/22/2017 CLINICAL DATA:  Left rib pain. IV drug user. Sepsis. Trauma a few weeks ago with reported broken ribs. EXAM: CT ANGIOGRAPHY CHEST WITH CONTRAST TECHNIQUE: Multidetector CT imaging of the chest was performed using the standard protocol during bolus administration of intravenous contrast. Multiplanar CT image reconstructions and MIPs were obtained to evaluate the vascular anatomy. CONTRAST:  100 mL of Isovue 370 COMPARISON:  None. FINDINGS: Cardiovascular: The thoracic aorta is non aneurysmal. No dissection. The heart is unremarkable. No pulmonary emboli identified. Mediastinum/Nodes: Small bilateral pleural effusions are identified. No pericardial effusion. The esophagus is normal in appearance. No adenopathy. Lungs/Pleura: The central airways are normal. No pneumothorax. Bibasilar opacities have a platelike appearance on the coronal view suggesting atelectasis. Developing infiltrate is considered less likely. A nodule in the right lobe on axial image 51 measures 4.4 mm. No other suspicious nodules. No masses. Upper Abdomen:  Limited views the upper abdomen are normal. Musculoskeletal: Nondisplaced fractures are seen of the anterior left fifth, sixth, and seventh ribs. There is a fracture through the anterior right sixth rib as well. No other fractures identified. Review of the MIP images confirms the above findings. IMPRESSION: 1. Bilateral rib fractures as above with small bilateral pleural effusions. Bibasilar dependent opacities are most consistent with atelectasis. 2. No pulmonary emboli. Electronically Signed   By: Gerome Samavid  Williams III M.D   On: 01/22/2017 16:52    Procedures Procedures (including critical care time)  Medications Ordered in ED Medications  levofloxacin (LEVAQUIN) tablet 500 mg (not administered)     Initial Impression / Assessment and Plan / ED Course  I have reviewed the triage vital signs and the nursing notes.  Pertinent labs & imaging results that were available during my care of the patient were reviewed by me and considered in my medical decision making (see chart for details).     Blood cultures negative to date.  CTA of the  chest demonstrates no PE or obvious pneumonia yesterday.  Chest x-ray today shows may be worsening atelectasis.  Patient be covered for possible early developing pneumonia with Levaquin.  Her vital signs are stable.  She walked to the room without any difficulty or hypoxia or increased work of breathing.  I recommended that she stop using heroin.  There is not an obvious murmur on examination this time.  I do not think she needs admission for endocarditis workup.  Blood cultures been negative thus far.  Patient understands return to the ER for new or worsening symptoms  Final Clinical Impressions(s) / ED Diagnoses   Final diagnoses:  Chest pain on breathing  Bronchitis    New Prescriptions New Prescriptions   LEVOFLOXACIN (LEVAQUIN) 500 MG TABLET    Take 1 tablet (500 mg total) by mouth daily.     Azalia Bilis, MD 01/23/17 [phone]

## 2017-01-23 NOTE — ED Notes (Signed)
2nd set of cultures sent to lab.  

## 2017-01-23 NOTE — ED Notes (Signed)
Patient transported to X-ray 
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
## 2017-01-25 LAB — CULTURE, BLOOD (ROUTINE X 2)
CULTURE: NO GROWTH
Culture: NO GROWTH
Special Requests: ADEQUATE
Special Requests: ADEQUATE

## 2017-01-27 LAB — CULTURE, BLOOD (ROUTINE X 2)
CULTURE: NO GROWTH
Culture: NO GROWTH
Special Requests: ADEQUATE
Special Requests: ADEQUATE

## 2017-01-28 LAB — CULTURE, BLOOD (ROUTINE X 2)
CULTURE: NO GROWTH
Culture: NO GROWTH
SPECIAL REQUESTS: ADEQUATE
Special Requests: ADEQUATE

## 2019-07-19 ENCOUNTER — Emergency Department: Payer: Self-pay

## 2019-07-19 ENCOUNTER — Inpatient Hospital Stay
Admission: EM | Admit: 2019-07-19 | Discharge: 2019-08-02 | DRG: 917 | Disposition: A | Discharge status: Home / Self Care | Payer: Self-pay | Attending: Internal Medicine | Admitting: Internal Medicine

## 2019-07-19 ENCOUNTER — Emergency Department
Admission: EM | Admit: 2019-07-19 | Discharge: 2019-07-19 | Disposition: A | Discharge status: Home / Self Care | Payer: Self-pay | Attending: Emergency Medicine | Admitting: Emergency Medicine

## 2019-07-19 ENCOUNTER — Other Ambulatory Visit: Payer: Self-pay

## 2019-07-19 ENCOUNTER — Encounter: Payer: Self-pay | Admitting: Emergency Medicine

## 2019-07-19 DIAGNOSIS — F419 Anxiety disorder, unspecified: Secondary | ICD-10-CM | POA: Diagnosis present

## 2019-07-19 DIAGNOSIS — A4102 Sepsis due to Methicillin resistant Staphylococcus aureus: Secondary | ICD-10-CM | POA: Diagnosis present

## 2019-07-19 DIAGNOSIS — F1910 Other psychoactive substance abuse, uncomplicated: Secondary | ICD-10-CM

## 2019-07-19 DIAGNOSIS — B9562 Methicillin resistant Staphylococcus aureus infection as the cause of diseases classified elsewhere: Secondary | ICD-10-CM | POA: Diagnosis present

## 2019-07-19 DIAGNOSIS — E860 Dehydration: Secondary | ICD-10-CM | POA: Diagnosis present

## 2019-07-19 DIAGNOSIS — B1920 Unspecified viral hepatitis C without hepatic coma: Secondary | ICD-10-CM | POA: Diagnosis present

## 2019-07-19 DIAGNOSIS — E876 Hypokalemia: Secondary | ICD-10-CM | POA: Diagnosis present

## 2019-07-19 DIAGNOSIS — L03211 Cellulitis of face: Secondary | ICD-10-CM | POA: Diagnosis present

## 2019-07-19 DIAGNOSIS — R7881 Bacteremia: Secondary | ICD-10-CM | POA: Diagnosis present

## 2019-07-19 DIAGNOSIS — F1410 Cocaine abuse, uncomplicated: Secondary | ICD-10-CM | POA: Diagnosis present

## 2019-07-19 DIAGNOSIS — A419 Sepsis, unspecified organism: Secondary | ICD-10-CM

## 2019-07-19 DIAGNOSIS — F17210 Nicotine dependence, cigarettes, uncomplicated: Secondary | ICD-10-CM | POA: Diagnosis present

## 2019-07-19 DIAGNOSIS — F1123 Opioid dependence with withdrawal: Secondary | ICD-10-CM | POA: Diagnosis present

## 2019-07-19 DIAGNOSIS — F1510 Other stimulant abuse, uncomplicated: Secondary | ICD-10-CM | POA: Diagnosis present

## 2019-07-19 DIAGNOSIS — R600 Localized edema: Secondary | ICD-10-CM | POA: Insufficient documentation

## 2019-07-19 DIAGNOSIS — Z20828 Contact with and (suspected) exposure to other viral communicable diseases: Secondary | ICD-10-CM | POA: Diagnosis present

## 2019-07-19 DIAGNOSIS — J189 Pneumonia, unspecified organism: Secondary | ICD-10-CM

## 2019-07-19 DIAGNOSIS — F1120 Opioid dependence, uncomplicated: Secondary | ICD-10-CM | POA: Diagnosis present

## 2019-07-19 DIAGNOSIS — R079 Chest pain, unspecified: Secondary | ICD-10-CM

## 2019-07-19 DIAGNOSIS — Z5321 Procedure and treatment not carried out due to patient leaving prior to being seen by health care provider: Secondary | ICD-10-CM | POA: Insufficient documentation

## 2019-07-19 DIAGNOSIS — G8929 Other chronic pain: Secondary | ICD-10-CM | POA: Diagnosis present

## 2019-07-19 DIAGNOSIS — J181 Lobar pneumonia, unspecified organism: Secondary | ICD-10-CM | POA: Diagnosis present

## 2019-07-19 DIAGNOSIS — I2690 Septic pulmonary embolism without acute cor pulmonale: Secondary | ICD-10-CM | POA: Diagnosis present

## 2019-07-19 DIAGNOSIS — E871 Hypo-osmolality and hyponatremia: Secondary | ICD-10-CM | POA: Diagnosis present

## 2019-07-19 DIAGNOSIS — T401X1A Poisoning by heroin, accidental (unintentional), initial encounter: Principal | ICD-10-CM | POA: Diagnosis present

## 2019-07-19 DIAGNOSIS — F1990 Other psychoactive substance use, unspecified, uncomplicated: Secondary | ICD-10-CM | POA: Diagnosis present

## 2019-07-19 DIAGNOSIS — I76 Septic arterial embolism: Secondary | ICD-10-CM

## 2019-07-19 DIAGNOSIS — F1920 Other psychoactive substance dependence, uncomplicated: Secondary | ICD-10-CM | POA: Diagnosis present

## 2019-07-19 DIAGNOSIS — Z808 Family history of malignant neoplasm of other organs or systems: Secondary | ICD-10-CM

## 2019-07-19 LAB — URINALYSIS, ROUTINE W REFLEX MICROSCOPIC
Bilirubin Urine: NEGATIVE
Glucose, UA: NEGATIVE mg/dL
Hgb urine dipstick: NEGATIVE
Ketones, ur: NEGATIVE mg/dL
Leukocytes,Ua: NEGATIVE
Nitrite: NEGATIVE
Protein, ur: 100 mg/dL — AB
Specific Gravity, Urine: 1.029 (ref 1.005–1.030)
pH: 5 (ref 5.0–8.0)

## 2019-07-19 LAB — CBC WITH DIFFERENTIAL/PLATELET
Abs Immature Granulocytes: 0 10*3/uL (ref 0.00–0.07)
Basophils Absolute: 0 10*3/uL (ref 0.0–0.1)
Basophils Relative: 0 %
Eosinophils Absolute: 0 10*3/uL (ref 0.0–0.5)
Eosinophils Relative: 0 %
HCT: 35.3 % — ABNORMAL LOW (ref 36.0–46.0)
Hemoglobin: 11.2 g/dL — ABNORMAL LOW (ref 12.0–15.0)
Lymphocytes Relative: 42 %
Lymphs Abs: 15.3 10*3/uL — ABNORMAL HIGH (ref 0.7–4.0)
MCH: 26.7 pg (ref 26.0–34.0)
MCHC: 31.7 g/dL (ref 30.0–36.0)
MCV: 84 fL (ref 80.0–100.0)
Monocytes Absolute: 2.2 10*3/uL — ABNORMAL HIGH (ref 0.1–1.0)
Monocytes Relative: 6 %
Neutro Abs: 18.2 10*3/uL — ABNORMAL HIGH (ref 1.7–7.7)
Neutrophils Relative %: 50 %
Other: 2 %
Platelets: 311 10*3/uL (ref 150–400)
RBC: 4.2 MIL/uL (ref 3.87–5.11)
RDW: 17.9 % — ABNORMAL HIGH (ref 11.5–15.5)
Smear Review: NORMAL
WBC Morphology: ABNORMAL
WBC: 36.4 10*3/uL — ABNORMAL HIGH (ref 4.0–10.5)
nRBC: 0 % (ref 0.0–0.2)

## 2019-07-19 LAB — COMPREHENSIVE METABOLIC PANEL
ALT: 54 U/L — ABNORMAL HIGH (ref 0–44)
AST: 36 U/L (ref 15–41)
Albumin: 3.3 g/dL — ABNORMAL LOW (ref 3.5–5.0)
Alkaline Phosphatase: 283 U/L — ABNORMAL HIGH (ref 38–126)
Anion gap: 9 (ref 5–15)
BUN: 12 mg/dL (ref 6–20)
CO2: 26 mmol/L (ref 22–32)
Calcium: 8.9 mg/dL (ref 8.9–10.3)
Chloride: 94 mmol/L — ABNORMAL LOW (ref 98–111)
Creatinine, Ser: 0.73 mg/dL (ref 0.44–1.00)
GFR calc Af Amer: >60 mL/min (ref >60)
GFR calc non Af Amer: >60 mL/min (ref >60)
Glucose, Bld: 102 mg/dL — ABNORMAL HIGH (ref 70–99)
Potassium: 4.2 mmol/L (ref 3.5–5.1)
Sodium: 129 mmol/L — ABNORMAL LOW (ref 135–145)
Total Bilirubin: 1.1 mg/dL (ref 0.3–1.2)
Total Protein: 7.6 g/dL (ref 6.5–8.1)

## 2019-07-19 LAB — PROTIME-INR
INR: 1.1 (ref 0.8–1.2)
Prothrombin Time: 13.6 seconds (ref 11.4–15.2)

## 2019-07-19 LAB — URINE DRUG SCREEN, QUALITATIVE (ARMC ONLY)
Amphetamines, Ur Screen: POSITIVE — AB
Barbiturates, Ur Screen: NOT DETECTED
Benzodiazepine, Ur Scrn: NOT DETECTED
Cannabinoid 50 Ng, Ur ~~LOC~~: POSITIVE — AB
Cocaine Metabolite,Ur ~~LOC~~: NOT DETECTED
MDMA (Ecstasy)Ur Screen: NOT DETECTED
Methadone Scn, Ur: NOT DETECTED
Opiate, Ur Screen: POSITIVE — AB
Phencyclidine (PCP) Ur S: NOT DETECTED
Tricyclic, Ur Screen: NOT DETECTED

## 2019-07-19 LAB — ACETAMINOPHEN LEVEL: Acetaminophen (Tylenol), Serum: <10 ug/mL — ABNORMAL LOW (ref 10–30)

## 2019-07-19 LAB — APTT: aPTT: 35 seconds (ref 24–36)

## 2019-07-19 LAB — LIPASE, BLOOD: Lipase: 23 U/L (ref 11–51)

## 2019-07-19 LAB — LACTIC ACID, PLASMA: Lactic Acid, Venous: 1.5 mmol/L (ref 0.5–1.9)

## 2019-07-19 LAB — SALICYLATE LEVEL: Salicylate Lvl: <7 mg/dL (ref 2.8–30.0)

## 2019-07-19 LAB — POCT PREGNANCY, URINE: Preg Test, Ur: NEGATIVE

## 2019-07-19 MED ORDER — ACETAMINOPHEN 500 MG PO TABS
1000.0000 mg | ORAL_TABLET | Freq: Once | ORAL | Status: AC
  Administered 2019-07-19: 18:00:00 1000 mg via ORAL

## 2019-07-19 MED ORDER — VANCOMYCIN HCL IN DEXTROSE 1-5 GM/200ML-% IV SOLN
1000.0000 mg | Freq: Once | INTRAVENOUS | Status: AC
  Administered 2019-07-19: 19:00:00 1000 mg via INTRAVENOUS
  Filled 2019-07-19: qty 200

## 2019-07-19 MED ORDER — ONDANSETRON HCL 4 MG PO TABS: 4.0000 mg | ORAL_TABLET | Freq: Four times a day (QID) | ORAL | Status: DC | PRN

## 2019-07-19 MED ORDER — SODIUM CHLORIDE 0.9 % IV BOLUS
1000.0000 mL | Freq: Once | INTRAVENOUS | Status: AC
  Administered 2019-07-19: 19:00:00 1000 mL via INTRAVENOUS

## 2019-07-19 MED ORDER — SODIUM CHLORIDE 0.9 % IV SOLN
INTRAVENOUS | Status: DC
  Administered 2019-07-20 – 2019-07-25 (×11): via INTRAVENOUS

## 2019-07-19 MED ORDER — METRONIDAZOLE IN NACL 5-0.79 MG/ML-% IV SOLN
500.0000 mg | Freq: Three times a day (TID) | INTRAVENOUS | Status: DC
  Administered 2019-07-19: 500 mg via INTRAVENOUS
  Filled 2019-07-19 (×3): qty 100

## 2019-07-19 MED ORDER — KETOROLAC TROMETHAMINE 30 MG/ML IJ SOLN
15.0000 mg | INTRAMUSCULAR | Status: AC
  Administered 2019-07-19: 17:00:00 15 mg via INTRAVENOUS
  Filled 2019-07-19: qty 1

## 2019-07-19 MED ORDER — METRONIDAZOLE IN NACL 5-0.79 MG/ML-% IV SOLN
500.0000 mg | Freq: Once | INTRAVENOUS | Status: AC
  Administered 2019-07-19: 18:00:00 500 mg via INTRAVENOUS
  Filled 2019-07-19: qty 100

## 2019-07-19 MED ORDER — VANCOMYCIN HCL IN DEXTROSE 750-5 MG/150ML-% IV SOLN
750.0000 mg | Freq: Two times a day (BID) | INTRAVENOUS | Status: DC
  Administered 2019-07-20 – 2019-07-22 (×5): 750 mg via INTRAVENOUS
  Filled 2019-07-19 (×6): qty 150

## 2019-07-19 MED ORDER — LORAZEPAM 2 MG/ML IJ SOLN
INTRAMUSCULAR | Status: AC
  Administered 2019-07-19: 18:00:00 1 mg via INTRAVENOUS
  Filled 2019-07-19: qty 1

## 2019-07-19 MED ORDER — ACETAMINOPHEN 650 MG RE SUPP: 650.0000 mg | Freq: Four times a day (QID) | RECTAL | Status: DC | PRN

## 2019-07-19 MED ORDER — ONDANSETRON HCL 4 MG/2ML IJ SOLN
4.0000 mg | Freq: Once | INTRAMUSCULAR | Status: AC
  Administered 2019-07-19: 17:00:00 4 mg via INTRAVENOUS
  Filled 2019-07-19: qty 2

## 2019-07-19 MED ORDER — METRONIDAZOLE IN NACL 5-0.79 MG/ML-% IV SOLN
500.0000 mg | Freq: Three times a day (TID) | INTRAVENOUS | Status: DC
  Administered 2019-07-20: 500 mg via INTRAVENOUS

## 2019-07-19 MED ORDER — SODIUM CHLORIDE 0.9 % IV SOLN
2.0000 g | Freq: Three times a day (TID) | INTRAVENOUS | Status: DC
  Administered 2019-07-20: 2 g via INTRAVENOUS
  Filled 2019-07-19: qty 2

## 2019-07-19 MED ORDER — ONDANSETRON HCL 4 MG/2ML IJ SOLN
4.0000 mg | Freq: Four times a day (QID) | INTRAMUSCULAR | Status: DC | PRN
  Filled 2019-07-19: qty 2

## 2019-07-19 MED ORDER — SODIUM CHLORIDE 0.9 % IV SOLN
2.0000 g | Freq: Once | INTRAVENOUS | Status: AC
  Administered 2019-07-19: 17:00:00 2 g via INTRAVENOUS
  Filled 2019-07-19: qty 2

## 2019-07-19 MED ORDER — IOHEXOL 350 MG/ML SOLN
75.0000 mL | Freq: Once | INTRAVENOUS | Status: AC | PRN
  Administered 2019-07-19: 18:00:00 75 mL via INTRAVENOUS

## 2019-07-19 MED ORDER — ACETAMINOPHEN 325 MG PO TABS
650.0000 mg | ORAL_TABLET | Freq: Four times a day (QID) | ORAL | Status: DC | PRN
  Administered 2019-07-19 – 2019-07-21 (×3): 650 mg via ORAL
  Filled 2019-07-19 (×3): qty 2

## 2019-07-19 MED ORDER — LORAZEPAM 2 MG/ML IJ SOLN
1.0000 mg | Freq: Once | INTRAMUSCULAR | Status: AC
  Administered 2019-07-19: 18:00:00 1 mg via INTRAVENOUS

## 2019-07-19 NOTE — ED Notes (Signed)
Pt leaving for CT. Will hang fluids and vanc once pt back.

## 2019-07-19 NOTE — ED Notes (Signed)
Provider at bedside talking with/assessing pt again. Will complete EKG once provider done.

## 2019-07-19 NOTE — Consult Note (Signed)
PHARMACY -  BRIEF ANTIBIOTIC NOTE   Pharmacy has received consult(s) for vancomycin and cefepime from an ED provider. Patient has also been ordered metronidazole. The patient's profile has been reviewed for ht/wt/allergies/indication/available labs.  Allergies to antibiotics include Sulfa (shortness of breath)  One time order(s) placed for  -cefepime 2 g already ordered -vancomycin 1 g already ordered  Further antibiotics/pharmacy consults should be ordered by admitting physician if indicated.                       Thank you, [place] Resident 07/19/2019  3:40 PM

## 2019-07-19 NOTE — ED Triage Notes (Signed)
Presents with facial swelling to right side of facial for the past 2 days  States recently was told that she had bacteria in blood  Left hosp AMA

## 2019-07-19 NOTE — ED Notes (Signed)
Still unable to get 20g R ac IV to flush properly; [place] notified.

## 2019-07-19 NOTE — ED Notes (Signed)
Collyn RN attempted for 22g IV L foot with okay from EDP. English as a second language teacher at bedside with [place].

## 2019-07-19 NOTE — Progress Notes (Signed)
Pharmacy Antibiotic Note  Eileen [name] is a 38 y.o. female admitted on 07/19/2019 with pneumonia/facial cellulitis/ IV drug abuse (?bacteremia)  Pharmacy has been consulted for Cefepime and Vancomycin dosing.  Also on Metronidazole Per pt: ~ 1 month ago was in hospital in [place] w/ [place]. bacteremia and left AMA  Plan: - Cefepime 2 gm given in ED. Will continue with Cefepime 2 gram IV q8h - Vancomycin 1 gram IV given in ED. Will continue with Vancomycin 750 mg IV q12h.  Goal AUC 400-550. Expected AUC: 534 SCr used: 0.80    Cmin 15.1    Height: 5\' 4"  (162.6 cm) Weight: 120 lb (54.4 kg) IBW/kg (Calculated) : 54.7  Temp (24hrs), Avg:100.7 F (38.2 C), Min:99.5 F (37.5 C), Max:102.4 F (39.1 C)  Recent Labs  Lab 07/19/19 1537  WBC 36.4*  CREATININE 0.73  LATICACIDVEN 1.5    Estimated Creatinine Clearance: 81.1 mL/min (by C-G formula based on SCr of 0.73 mg/dL).    Allergies  Allergen Reactions  . Sulfa Antibiotics Shortness Of Breath    Antimicrobials this admission: Cefepime 10/29 >>   Vanc 10/29 >> Metro 10/29 >>    Dose adjustments this admission:    Microbiology results: 10/29 BCx: pending 10/29 UCx: pending    Sputum:    10/29 MRSA PCR: pending  Thank you for allowing pharmacy to be a part of this patient's care.  Girolamo Lortie A 07/19/2019 8:19 PM

## 2019-07-19 NOTE — Progress Notes (Signed)
CODE SEPSIS - PHARMACY COMMUNICATION  **Broad Spectrum Antibiotics should be administered within 1 hour of Sepsis diagnosis**  Time Code Sepsis Called/Page Received: 1526  Antibiotics Ordered: cefepime, metronidazole, and vancomycin  Time of 1st antibiotic administration: 9012  Additional action taken by pharmacy: Spoke with RN at 1612: Just obtained second set of cultures- getting ready to start abx   [place] Resident 07/19/2019  3:55 PM

## 2019-07-19 NOTE — ED Notes (Signed)
Pt stuck X 3 times.-2 by RN and Claiborne Billings, RN X 1 without success of PIV insertion. Blood sent to lab.

## 2019-07-19 NOTE — ED Notes (Signed)
Visitor remains at bedside. Pt continues to sleep but is easily woken. Drooling. Sats remain high 90s.

## 2019-07-19 NOTE — ED Notes (Addendum)
Called pharm. Walid confirmed to pause flagyl and stated he would adjust times for med to be given. Stopped - will restart once due.

## 2019-07-19 NOTE — ED Notes (Signed)
Attempted for 22g IV at posterior L fa.

## 2019-07-19 NOTE — ED Triage Notes (Signed)
Here with gross facial swelling for the past 2 days. Left ama from triage this am. Was told recently she has bacteria in her blood. NAD.

## 2019-07-19 NOTE — ED Notes (Signed)
IV team at bedside 

## 2019-07-19 NOTE — ED Notes (Signed)
Pt visualized leaving ED, walking without difficulty. This Animator, RN attempted to stop patient and explain that she would be taken back to bed, this RN followed patient outside to attempt to explain pt repeatedly states to this RN "I'm leaving, I'm leaving, I'm leaving!".

## 2019-07-19 NOTE — ED Notes (Signed)
Pt to xray

## 2019-07-19 NOTE — ED Notes (Signed)
Called pharm to request flagyl dose be sent as ED main pyxis out.

## 2019-07-19 NOTE — ED Notes (Signed)
This RN entered room when noted via monitor that pt was unattaching self from vital sign machine. Pt found standing at bedside unplugging self. Asked if pt needed to use restroom or why unplugging self. Pt requesting food and pain meds. Denied any other needs. Reminded to use call bell attached to bedrail for any needs. Pt agreeable to get back in bed.

## 2019-07-19 NOTE — ED Notes (Signed)
EKG completed and given to [place] in person.

## 2019-07-19 NOTE — ED Notes (Signed)
EDP Joni Fears states will look for additional IV access using [place] but also requested this RN place IV team order. Placed.

## 2019-07-19 NOTE — ED Notes (Signed)
Pt has calmed down some. Not rocking back and forth in bed anymore. Continues to groan.

## 2019-07-19 NOTE — ED Notes (Signed)
Pt c/o pain at abd (cramping like earlier today) and R side of face. Given snack (soft foods only as ordered) and drink.

## 2019-07-19 NOTE — ED Notes (Signed)
Swelling to R side of pt's face has steadily gotten worse throughout day. Sats remain 100% RA. Pt sleeping currently. Bed locked low. Call bell within reach. Rail up.

## 2019-07-19 NOTE — ED Notes (Signed)
EDP [name] notified IV team yet to see pt. Pt still only has 24g IV.

## 2019-07-19 NOTE — ED Notes (Signed)
Pt to be moved to room 10 momentarily to complete EKG. Charge RN gave the okay.

## 2019-07-19 NOTE — ED Notes (Signed)
EDP [name] at bedside assessing 20g IV.

## 2019-07-19 NOTE — ED Notes (Signed)
RN to bedside to hang antibiotic.Pt adamant she needs to urinate. Given specimen cup and agrees to provide sample. Pt up to restroom.

## 2019-07-19 NOTE — H&P (Addendum)
[facility] - [place] at [facility]   PATIENT NAME: Eileen [name]    MR#:  [id number]  DATE OF BIRTH:  1981-04-29  DATE OF ADMISSION:  07/19/2019  PRIMARY CARE PHYSICIAN: Patient, No Pcp Per   REQUESTING/REFERRING PHYSICIAN: Dr. Alfonse Flavors  CHIEF COMPLAINT:   Facial swelling and chest pain HISTORY OF PRESENT ILLNESS:  Eileen [name]  is a 38 y.o. female with a known history of IV drug abuse and polysubstance abuse comes to the emergency room with complaints of left-sided chest pain that hurts when she breathes. She also has right facial cellulitis worsening for past two days. She uses IV drug and last used was today. She used IV heroin. She reports that the month ago she was at the hospital in [place] by they told her she had bacteria and her blood ended up living against medical advice and not taken any antibiotics thereafter.  Patient fever of 102, tachycardia, elevated white count of 36,000 with right facial cellulitis and IV drug track marks present on her arms. She is being admitted with sepsis secondary to IV drug abuse with multifocal pneumonia right facial swelling.  During my evaluation patient remained sedated quite sleepy due to Ativan given for her CT scan. History is obtained from ER physician no family in the ER  PAST MEDICAL HISTORY:   Past Medical History:  Diagnosis Date  . Anxiety   . IVDU (intravenous drug user)   . Polysubstance abuse (HCC)     PAST SURGICAL HISTOIRY:  History reviewed. No pertinent surgical history.  SOCIAL HISTORY:   Social History   Tobacco Use  . Smoking status: Current Every Day Smoker    Packs/day: 1.00    Types: Cigarettes  . Smokeless tobacco: Never Used  Substance Use Topics  . Alcohol use: Yes    Comment: BAC not available at time of writing    FAMILY HISTORY:   Family History  Problem Relation Age of Onset  . Brain cancer Father   . Brain cancer Brother     DRUG ALLERGIES:    Allergies  Allergen Reactions  . Sulfa Antibiotics Shortness Of Breath    REVIEW OF SYSTEMS:  Review of Systems  Unable to perform ROS: Medical condition     MEDICATIONS AT HOME:   Prior to Admission medications   Medication Sig Start Date End Date Taking? Authorizing Provider  buprenorphine-naloxone (SUBOXONE) 8-2 mg SUBL SL tablet Place 1 tablet under the tongue daily.    [provider]  levofloxacin (LEVAQUIN) 500 MG tablet Take 1 tablet (500 mg total) by mouth daily. Patient not taking: Reported on 07/19/2019 01/23/17   Azalia Bilis, MD      VITAL SIGNS:  Blood pressure 130/67, pulse 87, temperature 100.2 F (37.9 C), temperature source Axillary, resp. rate 19, height 5\' 4"  (1.626 m), weight 54.4 kg, last menstrual period 02/16/2019, SpO2 95 %.  PHYSICAL EXAMINATION:  GENERAL:  38 y.o.-year-old patient lying in the bed with no acute distress.  EYES: Pupils equal, round, reactive to light and accommodation. No scleral icterus. Extraocular muscles intact.  HEENT: Head atraumatic, normocephalic. Oropharynx and nasopharynx clear.    NECK:  Supple, no jugular venous distention. No thyroid enlargement, no tenderness.  LUNGS: Normal breath sounds bilaterally, no wheezing, rales,rhonchi or crepitation. No use of accessory muscles of respiration.  CARDIOVASCULAR: S1, S2 normal. No murmurs, rubs, or gallops.  ABDOMEN: Soft, nontender, nondistended. Bowel sounds present. No organomegaly or mass.  EXTREMITIES: No pedal edema, cyanosis,  or clubbing.    NEUROLOGIC: sedated but moves all her extremities well  PSYCHIATRIC: The patient is sedated from Ativan given earlier SKIN: track marks on the arms  LABORATORY PANEL:   CBC Recent Labs  Lab 07/19/19 1537  WBC 36.4*  HGB 11.2*  HCT 35.3*  PLT 311   ------------------------------------------------------------------------------------------------------------------  Chemistries  Recent Labs  Lab 07/19/19 1537  NA  129*  K 4.2  CL 94*  CO2 26  GLUCOSE 102*  BUN 12  CREATININE 0.73  CALCIUM 8.9  AST 36  ALT 54*  ALKPHOS 283*  BILITOT 1.1   ------------------------------------------------------------------------------------------------------------------  Cardiac Enzymes No results for input(s): TROPONINI in the last 168 hours. ------------------------------------------------------------------------------------------------------------------  RADIOLOGY:  Dg Chest 2 View  Result Date: 07/19/2019 CLINICAL DATA:  Chest pain, IV drug abuse. Additional history provided: Patient presents with facial swelling to right side of face for 2 days, bacteremia per patient EXAM: CHEST - 2 VIEW COMPARISON:  Chest radiograph 01/23/2017 FINDINGS: Shallow inspiration radiograph with bibasilar atelectasis. There is multifocal opacity within the mid to lower left lung and possibly also at the right lung base. No evidence of pleural effusion or pneumothorax. Heart size within normal limits. No acute bony abnormality. IMPRESSION: Multifocal opacity within the mid to lower left lung and possibly also at the right lung base. Findings likely reflect multifocal pneumonia. Electronically Signed   By: Kellie Simmering DO   On: 07/19/2019 17:10   Ct Maxillofacial W Contrast  Result Date: 07/19/2019 CLINICAL DATA:  Right facial swelling.  IV drug abuse. EXAM: CT MAXILLOFACIAL WITH CONTRAST TECHNIQUE: Multidetector CT imaging of the maxillofacial structures was performed with intravenous contrast. Multiplanar CT image reconstructions were also generated. CONTRAST:  82mL OMNIPAQUE IOHEXOL 350 MG/ML SOLN COMPARISON:  None. FINDINGS: Osseous: Negative for fracture. No acute bony abnormality. Periapical lucency around left upper third molar. Mild lucency around right lower third molar. Orbits: No soft tissue edema or mass. Sinuses: Clear bilaterally. Soft tissues: Extensive soft tissue swelling throughout the right face overlying the maxilla.  No focal mass or abscess. 9 mm right submandibular lymph node. 13 mm right level 2 lymph node. These are likely reactive due to cellulitis. Limited intracranial: Negative IMPRESSION: Extensive soft tissue swelling right face. No abscess or mass. Probable cellulitis. No definite dental source identified. Electronically Signed   By: Franchot Gallo M.D.   On: 07/19/2019 18:41    EKG:    IMPRESSION AND PLAN:   Dwanna Goshert  is a 38 y.o. female with a known history of IV drug abuse and polysubstance abuse comes to the emergency room with complaints of left-sided chest pain that hurts when she breathes. She also has right facial cellulitis worsening for past two days.  1. Sepsis secondary to multifocal pneumonia and right facial swelling in the setting of IV drug abuse (heroin) -admit to medical floor with telemetry -IV fluids -broad-spectrum antibiotic with vancomycin, Flagyl, cefepime -ID consultation with Dr. Delaine Lame-- informed -consider surgical consultation for facial swelling if needed -CT maxillofacial shows diffuse soft tissue swelling no collection of pulse noted -PRN pain meds -echo of the heart -consider TEE rule out endocarditis -follow blood culture and narrow down antibiotics accordingly  2. Multifocal pneumonia secondary to IV drug abuse -oxygen saturations normal -continue above antibiotics  3. Leukocytosis secondary to infection -follow CBC  4. Hyponatremia hypokalemia secondary to dehydration from poor oral intake -IV fluids for hydration  5. DVT prophylaxis Lovenox   All the records are reviewed and case discussed with  ED provider.   CODE STATUS: full  TOTAL critical TIME TAKING CARE OF THIS PATIENT: *50* minutes.    Enedina FinnerSona Evia Goldsmith M.D on 07/19/2019 at 7:52 PM  Between 7am to 6pm - Pager - [phone]  After 6pm go to [email or web address] - password [email or web address] [facility]  [facility]  Office  [phone]  CC: Primary care physician; Patient, No Pcp Per

## 2019-07-19 NOTE — ED Notes (Signed)
IV team states unable to gain better IV access. EDP [name] notified in person. EDP [name] getting [place] in attempt to obtain further IV access himself.

## 2019-07-19 NOTE — ED Notes (Signed)
Attempted for 22g at L fa. See flash of blood each time but won't thread d/t scarring. Pt has wounds and scarring all over arms.

## 2019-07-19 NOTE — ED Provider Notes (Signed)
[facility] Emergency Department Provider Note  ____________________________________________  Time seen: Approximately 7:41 PM  I have reviewed the triage vital signs and the nursing notes.   HISTORY  Chief Complaint Facial Swelling    HPI Eileen [name] is a 38 y.o. female with a history of IV drug use and polysubstance abuse who comes the ED complaining of left-sided chest pain that hurts when she breathes.  Constant, no alleviating factors.  Severe.  Patient also notes right facial cellulitis has been worsening for the past 2 days.  She reports that a month ago she was at a hospital in [place] where they told her she had bacteria in her blood.  She ended up leaving there [place] and has not taken antibiotics since.     Past Medical History:  Diagnosis Date  . Anxiety   . IVDU (intravenous drug user)   . Polysubstance abuse [facility])      Patient Active Problem List   Diagnosis Date Noted  . SIRS (systemic inflammatory response syndrome) ([place]) 01/22/2017  . Sepsis ([place]) 01/20/2017  . Mood disorder in conditions classified elsewhere   . Polysubstance dependence including opioid drug with daily use ([place]) 09/09/2016     History reviewed. No pertinent surgical history.   Prior to Admission medications   Medication Sig Start Date End Date Taking? Authorizing Provider  buprenorphine-naloxone (SUBOXONE) 8-2 mg SUBL SL tablet Place 1 tablet under the tongue daily.    [provider]  levofloxacin (LEVAQUIN) 500 MG tablet Take 1 tablet (500 mg total) by mouth daily. Patient not taking: Reported on 07/19/2019 01/23/17   Jola Schmidt, MD     Allergies Sulfa antibiotics   Family History  Problem Relation Age of Onset  . Brain cancer Father   . Brain cancer Brother     Social History Social History   Tobacco Use  . Smoking status: Current Every Day Smoker    Packs/day: 1.00    Types: Cigarettes  . Smokeless tobacco:  Never Used  Substance Use Topics  . Alcohol use: Yes    Comment: BAC not available at time of writing  . Drug use: Yes    Types: IV, Heroin, Cocaine, Methamphetamines    Comment: UDS not available at time of writing    Review of Systems  Constitutional:   No fever positive chills.  ENT:   No sore throat. No rhinorrhea. Cardiovascular: Positive chest pain as above without syncope. Respiratory:   No dyspnea or cough. Gastrointestinal:   Positive left upper abdominal pain without vomiting or diarrhea Musculoskeletal:   Negative for focal pain or swelling All other systems reviewed and are negative except as documented above in ROS and HPI.  ____________________________________________   PHYSICAL EXAM:  VITAL SIGNS: ED Triage Vitals  Enc Vitals Group     BP 07/19/19 1453 (!) 144/95     Pulse Rate 07/19/19 1453 94     Resp 07/19/19 1453 20     Temp 07/19/19 1453 99.5 F (37.5 C)     Temp Source 07/19/19 1453 Oral     SpO2 07/19/19 1453 100 %     Weight 07/19/19 1452 120 lb (54.4 kg)     Height 07/19/19 1452 5\' 4"  (1.626 m)     Head Circumference --      Peak Flow --      Pain Score --      Pain Loc --      Pain Edu? --  Excl. in GC? --     Vital signs reviewed, nursing assessments reviewed.   Constitutional:   Alert and oriented.  Ill-appearing Eyes:   Conjunctivae are normal. EOMI. PERRL. ENT      Head:   Grossly swollen right face over the maxilla and right mandible.  No swelling over the submental space..      Nose: Swelling of the nose.  No purulent drainage from the nose.      Mouth/Throat: Poor dentition.  No tongue elevation, no floor of mouth swelling.  No gingival swelling.  No tonsillar swelling      Neck:   No meningismus. Full ROM. Hematological/Lymphatic/Immunilogical:   Positive cervical lymphadenopathy. Cardiovascular:   Tachycardia heart rate 100. Symmetric bilateral radial and DP pulses.  No murmurs. Cap refill less than 2 seconds. Respiratory:    Normal respiratory effort without tachypnea/retractions. Breath sounds are clear and equal bilaterally. No wheezes/rales/rhonchi. Gastrointestinal:   Soft with generalized tenderness. Non distended.  No rebound, rigidity, or guarding. Musculoskeletal:   Normal range of motion in all extremities. No joint effusions.  No lower extremity tenderness.  No edema. Neurologic:   Normal speech and language.  Motor grossly intact. No acute focal neurologic deficits are appreciated.  Skin:    Skin is warm, dry and intact.  Multiple track marks bilateral upper extremities.  No inflammatory changes to suggest cellulitis abscess or necrotizing fasciitis.  No rash noted.  No petechiae, purpura, or bullae.  ____________________________________________    LABS (pertinent positives/negatives) (all labs ordered are listed, but only abnormal results are displayed) Labs Reviewed  COMPREHENSIVE METABOLIC PANEL - Abnormal; Notable for the following components:      Result Value   Sodium 129 (*)    Chloride 94 (*)    Glucose, Bld 102 (*)    Albumin 3.3 (*)    ALT 54 (*)    Alkaline Phosphatase 283 (*)    All other components within normal limits  CBC WITH DIFFERENTIAL/PLATELET - Abnormal; Notable for the following components:   WBC 36.4 (*)    Hemoglobin 11.2 (*)    HCT 35.3 (*)    RDW 17.9 (*)    Neutro Abs 18.2 (*)    Lymphs Abs 15.3 (*)    Monocytes Absolute 2.2 (*)    All other components within normal limits  URINALYSIS, ROUTINE W REFLEX MICROSCOPIC - Abnormal; Notable for the following components:   Color, Urine AMBER (*)    APPearance HAZY (*)    Protein, ur 100 (*)    Bacteria, UA MANY (*)    All other components within normal limits  ACETAMINOPHEN LEVEL - Abnormal; Notable for the following components:   Acetaminophen (Tylenol), Serum <10 (*)    All other components within normal limits  URINE DRUG SCREEN, QUALITATIVE ([facility] ONLY) - Abnormal; Notable for the following components:    Amphetamines, Ur Screen POSITIVE (*)    Opiate, Ur Screen POSITIVE (*)    Cannabinoid 50 Ng, Ur [place] POSITIVE (*)    All other components within normal limits  CULTURE, BLOOD (ROUTINE X 2)  CULTURE, BLOOD (ROUTINE X 2)  URINE CULTURE  SARS CORONAVIRUS 2 (TAT 6-24 HRS)  LACTIC ACID, PLASMA  SALICYLATE LEVEL  PROTIME-INR  APTT  LIPASE, BLOOD  PATHOLOGIST SMEAR REVIEW  POC URINE PREG, ED  POCT PREGNANCY, URINE   ____________________________________________   EKG  Interpreted by me Sinus tachycardia rate 123.  Normal axis and intervals.  Normal QRS ST segments and T waves.  ____________________________________________    RADIOLOGY  Dg Chest 2 View  Result Date: 07/19/2019 CLINICAL DATA:  Chest pain, IV drug abuse. Additional history provided: Patient presents with facial swelling to right side of face for 2 days, bacteremia per patient EXAM: CHEST - 2 VIEW COMPARISON:  Chest radiograph 01/23/2017 FINDINGS: Shallow inspiration radiograph with bibasilar atelectasis. There is multifocal opacity within the mid to lower left lung and possibly also at the right lung base. No evidence of pleural effusion or pneumothorax. Heart size within normal limits. No acute bony abnormality. IMPRESSION: Multifocal opacity within the mid to lower left lung and possibly also at the right lung base. Findings likely reflect multifocal pneumonia. Electronically Signed   By: Jackey LogeKyle  Golden DO   On: 07/19/2019 17:10   Ct Maxillofacial W Contrast  Result Date: 07/19/2019 CLINICAL DATA:  Right facial swelling.  IV drug abuse. EXAM: CT MAXILLOFACIAL WITH CONTRAST TECHNIQUE: Multidetector CT imaging of the maxillofacial structures was performed with intravenous contrast. Multiplanar CT image reconstructions were also generated. CONTRAST:  75mL OMNIPAQUE IOHEXOL 350 MG/ML SOLN COMPARISON:  None. FINDINGS: Osseous: Negative for fracture. No acute bony abnormality. Periapical lucency around left upper third molar. Mild  lucency around right lower third molar. Orbits: No soft tissue edema or mass. Sinuses: Clear bilaterally. Soft tissues: Extensive soft tissue swelling throughout the right face overlying the maxilla. No focal mass or abscess. 9 mm right submandibular lymph node. 13 mm right level 2 lymph node. These are likely reactive due to cellulitis. Limited intracranial: Negative IMPRESSION: Extensive soft tissue swelling right face. No abscess or mass. Probable cellulitis. No definite dental source identified. Electronically Signed   By: Marlan Palauharles  Clark M.D.   On: 07/19/2019 18:41    ____________________________________________   PROCEDURES .Critical Care Performed by: Sharman CheekStafford, Ellasyn Swilling, MD Authorized by: Sharman CheekStafford, Maylin [place], MD   Critical care provider statement:    Critical care time (minutes):  40   Critical care time was exclusive of:  Separately billable procedures and treating other patients   Critical care was necessary to treat or prevent imminent or life-threatening deterioration of the following conditions:  Sepsis   Critical care was time spent personally by me on the following activities:  Development of treatment plan with patient or surrogate, discussions with consultants, evaluation of patient's response to treatment, examination of patient, obtaining history from patient or surrogate, ordering and performing treatments and interventions, ordering and review of laboratory studies, ordering and review of radiographic studies, pulse oximetry, re-evaluation of patient's condition and review of old charts Comments:        Angiocath insertion  Date/Time: 07/19/2019 7:46 PM Performed by: Sharman CheekStafford, Megann Easterwood, MD Authorized by: Sharman CheekStafford, Shylin Keizer, MD  Consent: Verbal consent obtained. Risks and benefits: risks, benefits and alternatives were discussed Consent given by: patient Local anesthesia used: no  Anesthesia: Local anesthesia used: no  Sedation: Patient sedated: no  Patient tolerance:  patient tolerated the procedure well with no immediate complications Comments: Continuous ultrasound visualization     ____________________________________________  DIFFERENTIAL DIAGNOSIS   Pneumonia, endocarditis, septic embolism, colitis, diverticulitis, intra-abdominal abscess  CLINICAL IMPRESSION / ASSESSMENT AND PLAN / ED COURSE  Medications ordered in the ED: Medications  vancomycin (VANCOCIN) IVPB 1000 mg/200 mL premix (1,000 mg Intravenous New Bag/Given 07/19/19 1853)  ceFEPIme (MAXIPIME) 2 g in sodium chloride 0.9 % 100 mL IVPB (0 g Intravenous Stopped 07/19/19 1710)  metroNIDAZOLE (FLAGYL) IVPB 500 mg (500 mg Intravenous New Bag/Given 07/19/19 1753)  sodium chloride 0.9 % bolus 1,000 mL (0 mLs  Intravenous Paused 07/19/19 1910)  ketorolac (TORADOL) 30 MG/ML injection 15 mg (15 mg Intravenous Given 07/19/19 1702)  ondansetron (ZOFRAN) injection 4 mg (4 mg Intravenous Given 07/19/19 1701)  acetaminophen (TYLENOL) tablet 1,000 mg (1,000 mg Oral Given 07/19/19 1805)  iohexol (OMNIPAQUE) 350 MG/ML injection 75 mL (75 mLs Intravenous Contrast Given 07/19/19 1821)  LORazepam (ATIVAN) injection 1 mg (1 mg Intravenous Given 07/19/19 1818)    Pertinent labs & imaging results that were available during my care of the patient were reviewed by me and considered in my medical decision making (see chart for details).  Eileen [name] was evaluated in Emergency Department on 07/19/2019 for the symptoms described in the history of present illness. She was evaluated in the context of the global COVID-19 pandemic, which necessitated consideration that the patient might be at risk for infection with the SARS-CoV-2 virus that causes COVID-19. Institutional protocols and algorithms that pertain to the evaluation of patients at risk for COVID-19 are in a state of rapid change based on information released by regulatory bodies including the CDC and federal and state organizations. These policies and  algorithms were followed during the patient's care in the ED.   Patient presents with pleuritic chest pain, slightly elevated body temperature in the setting of IV drug use, reported recent bacteremia, most recent heroin injection today.  White blood cell count is 36,000.  Empiric antibiotics with cefepime Flagyl vancomycin.  Check chest x-ray and sepsis panel  Clinical Course as of Jul 18 1940  Thu Jul 19, 2019  1731 Chest x-ray concerning for multifocal pneumonia but in the setting possibly septic emboli.  We will proceed with CTA chest as well as CT of the face and abdomen.   [PS]  1802 Delay in obtaining suitable IV access due to scarring from IVDU.  RN unable to obtain adequate PIV. IV consult team unsuccessful. I placed PIV by [place] visualization   [PS]  1803 Pt developing fever despite toradol. Tachycardia, tachypnea, c/w evolving sepsis. Cont IV abx. Lactate normal.   [PS]    Clinical Course User Index [PS] Sharman Cheek, MD    ----------------------------------------- 7:47 PM on 07/19/2019 -----------------------------------------  CT is discussed with radiologist, consistent with pulmonary septic emboli as well as multiple abdominal findings including likely intrinsic liver disease, possible PID.  Patient receiving appropriate antibiotics.  Facial cellulitis without abscess on CT.  Discussed with hospitalist for further management.   ____________________________________________   FINAL CLINICAL IMPRESSION(S) / ED DIAGNOSES    Final diagnoses:  Facial cellulitis  Sepsis, due to unspecified organism, unspecified whether acute organ dysfunction present [facility])  Septic embolism (HCC)  IV drug user  Polysubstance abuse [facility])     ED Discharge Orders    None      Portions of this note were generated with dragon dictation software. Dictation errors may occur despite best attempts at proofreading.   Sharman Cheek, MD 07/19/19 [phone]

## 2019-07-19 NOTE — ED Notes (Signed)
Attempted for 22g IV at L ac. 

## 2019-07-20 ENCOUNTER — Inpatient Hospital Stay (HOSPITAL_COMMUNITY)
Admit: 2019-07-20 | Discharge: 2019-07-20 | Disposition: A | Discharge status: Home / Self Care | Payer: Self-pay | Attending: Internal Medicine | Admitting: Internal Medicine

## 2019-07-20 DIAGNOSIS — I361 Nonrheumatic tricuspid (valve) insufficiency: Secondary | ICD-10-CM

## 2019-07-20 DIAGNOSIS — R7881 Bacteremia: Secondary | ICD-10-CM

## 2019-07-20 DIAGNOSIS — Z881 Allergy status to other antibiotic agents status: Secondary | ICD-10-CM

## 2019-07-20 DIAGNOSIS — I340 Nonrheumatic mitral (valve) insufficiency: Secondary | ICD-10-CM

## 2019-07-20 DIAGNOSIS — F17210 Nicotine dependence, cigarettes, uncomplicated: Secondary | ICD-10-CM

## 2019-07-20 DIAGNOSIS — J181 Lobar pneumonia, unspecified organism: Secondary | ICD-10-CM

## 2019-07-20 DIAGNOSIS — L03211 Cellulitis of face: Secondary | ICD-10-CM

## 2019-07-20 DIAGNOSIS — B9562 Methicillin resistant Staphylococcus aureus infection as the cause of diseases classified elsewhere: Secondary | ICD-10-CM

## 2019-07-20 DIAGNOSIS — F1193 Opioid use, unspecified with withdrawal: Secondary | ICD-10-CM

## 2019-07-20 LAB — CBC
HCT: 32 % — ABNORMAL LOW (ref 36.0–46.0)
Hemoglobin: 10.4 g/dL — ABNORMAL LOW (ref 12.0–15.0)
MCH: 26.8 pg (ref 26.0–34.0)
MCHC: 32.5 g/dL (ref 30.0–36.0)
MCV: 82.5 fL (ref 80.0–100.0)
Platelets: 274 10*3/uL (ref 150–400)
RBC: 3.88 MIL/uL (ref 3.87–5.11)
RDW: 17.7 % — ABNORMAL HIGH (ref 11.5–15.5)
WBC: 27.6 10*3/uL — ABNORMAL HIGH (ref 4.0–10.5)
nRBC: 0 % (ref 0.0–0.2)

## 2019-07-20 LAB — CREATININE, SERUM
Creatinine, Ser: 0.83 mg/dL (ref 0.44–1.00)
GFR calc Af Amer: >60 mL/min (ref >60)
GFR calc non Af Amer: >60 mL/min (ref >60)

## 2019-07-20 LAB — BLOOD CULTURE ID PANEL (REFLEXED)

## 2019-07-20 LAB — ECHOCARDIOGRAM COMPLETE
Height: 64 in
Weight: 1920 oz

## 2019-07-20 LAB — URINE CULTURE: Culture: <10000 — AB

## 2019-07-20 LAB — SARS CORONAVIRUS 2 (TAT 6-24 HRS): SARS Coronavirus 2: NEGATIVE

## 2019-07-20 LAB — MRSA PCR SCREENING: MRSA by PCR: POSITIVE — AB

## 2019-07-20 LAB — PATHOLOGIST SMEAR REVIEW

## 2019-07-20 MED ORDER — LINEZOLID 600 MG/300ML IV SOLN
600.0000 mg | Freq: Two times a day (BID) | INTRAVENOUS | Status: DC
  Administered 2019-07-20 – 2019-07-26 (×11): 600 mg via INTRAVENOUS
  Filled 2019-07-20 (×13): qty 300

## 2019-07-20 MED ORDER — MUPIROCIN 2 % EX OINT
1.0000 "application " | TOPICAL_OINTMENT | Freq: Two times a day (BID) | CUTANEOUS | Status: AC
  Administered 2019-07-21 – 2019-07-25 (×10): 1 via NASAL
  Filled 2019-07-20: qty 22

## 2019-07-20 MED ORDER — LINEZOLID 600 MG/300ML IV SOLN
600.0000 mg | Freq: Two times a day (BID) | INTRAVENOUS | Status: DC
  Filled 2019-07-20 (×2): qty 300

## 2019-07-20 MED ORDER — NICOTINE 14 MG/24HR TD PT24: 14.0000 mg | MEDICATED_PATCH | Freq: Every day | TRANSDERMAL | Status: DC

## 2019-07-20 MED ORDER — NICOTINE 14 MG/24HR TD PT24
14.0000 mg | MEDICATED_PATCH | Freq: Every day | TRANSDERMAL | Status: DC
  Administered 2019-07-20 – 2019-08-02 (×14): 14 mg via TRANSDERMAL
  Filled 2019-07-20 (×14): qty 1

## 2019-07-20 MED ORDER — ORAL CARE MOUTH RINSE
15.0000 mL | Freq: Two times a day (BID) | OROMUCOSAL | Status: DC
  Administered 2019-07-21 – 2019-07-27 (×9): 15 mL via OROMUCOSAL

## 2019-07-20 MED ORDER — CHLORHEXIDINE GLUCONATE CLOTH 2 % EX PADS
6.0000 | MEDICATED_PAD | Freq: Every day | CUTANEOUS | Status: AC
  Administered 2019-07-21 – 2019-07-25 (×3): 6 via TOPICAL

## 2019-07-20 MED ORDER — ENOXAPARIN SODIUM 40 MG/0.4ML ~~LOC~~ SOLN
40.0000 mg | SUBCUTANEOUS | Status: DC
  Administered 2019-07-20: 11:00:00 40 mg via SUBCUTANEOUS
  Filled 2019-07-20 (×2): qty 0.4

## 2019-07-20 MED ORDER — CHLORHEXIDINE GLUCONATE 0.12 % MT SOLN
15.0000 mL | Freq: Two times a day (BID) | OROMUCOSAL | Status: DC
  Administered 2019-07-20 – 2019-08-02 (×25): 15 mL via OROMUCOSAL
  Filled 2019-07-20 (×25): qty 15

## 2019-07-20 MED ORDER — KETOROLAC TROMETHAMINE 30 MG/ML IJ SOLN
15.0000 mg | Freq: Three times a day (TID) | INTRAMUSCULAR | Status: AC | PRN
  Administered 2019-07-20 (×3): 15 mg via INTRAVENOUS
  Filled 2019-07-20 (×4): qty 1

## 2019-07-20 MED ORDER — ENOXAPARIN SODIUM 40 MG/0.4ML ~~LOC~~ SOLN
40.0000 mg | Freq: Every day | SUBCUTANEOUS | Status: DC
  Administered 2019-07-21 – 2019-08-01 (×8): 40 mg via SUBCUTANEOUS
  Filled 2019-07-20 (×12): qty 0.4

## 2019-07-20 NOTE — ED Notes (Signed)
Patient pulled out and occuded both IVs so vancomycin is currently paused and putting in IV consult for difficult stick as this nurse has stuck multiple times

## 2019-07-20 NOTE — Progress Notes (Signed)
*  PRELIMINARY RESULTS* Echocardiogram 2D Echocardiogram has been performed.  Eileen [name] 07/20/2019, 11:51 AM

## 2019-07-20 NOTE — ED Notes (Addendum)
This RN entered room; smells heavily of cigarettes. Explained to pt she is not allowed to smoke in hospital. Asked if she would like nicotine patch. Stated she would. Will notify attending. Pt label attached to pt's cigarettes; 2 cigarettes present in package; removed from bedside. To be given to security.

## 2019-07-20 NOTE — Progress Notes (Signed)
PHARMACY - PHYSICIAN COMMUNICATION CRITICAL VALUE ALERT - BLOOD CULTURE IDENTIFICATION (BCID)  Eileen [name] is an 38 y.o. female who presented to [facility] on 07/19/2019 with a chief complaint of facial swelling  Assessment:  Lab reported 4 of 4 bottles + for GPC, Staph, Mec A detected  Name of physician (or Provider) Contacted: Dr Marcille Blanco  Current antibiotics: Vancomycin, Cefepime, Flagyl  Changes to prescribed antibiotics recommended:  Patient is on recommended antibiotics - No changes needed  Results for orders placed or performed during the hospital encounter of 07/19/19  Blood Culture ID Panel (Reflexed) (Collected: 07/19/2019  3:39 PM)  Result Value Ref Range   Enterococcus species NOT DETECTED NOT DETECTED   Listeria monocytogenes NOT DETECTED NOT DETECTED   Staphylococcus species DETECTED (A) NOT DETECTED   Staphylococcus aureus (BCID) DETECTED (A) NOT DETECTED   Methicillin resistance DETECTED (A) NOT DETECTED   Streptococcus species NOT DETECTED NOT DETECTED   Streptococcus agalactiae NOT DETECTED NOT DETECTED   Streptococcus pneumoniae NOT DETECTED NOT DETECTED   Streptococcus pyogenes NOT DETECTED NOT DETECTED   Acinetobacter baumannii NOT DETECTED NOT DETECTED   Enterobacteriaceae species NOT DETECTED NOT DETECTED   Enterobacter cloacae complex NOT DETECTED NOT DETECTED   Escherichia coli NOT DETECTED NOT DETECTED   Klebsiella oxytoca NOT DETECTED NOT DETECTED   Klebsiella pneumoniae NOT DETECTED NOT DETECTED   Proteus species NOT DETECTED NOT DETECTED   Serratia marcescens NOT DETECTED NOT DETECTED   Haemophilus influenzae NOT DETECTED NOT DETECTED   Neisseria meningitidis NOT DETECTED NOT DETECTED   Pseudomonas aeruginosa NOT DETECTED NOT DETECTED   Candida albicans NOT DETECTED NOT DETECTED   Candida glabrata NOT DETECTED NOT DETECTED   Candida krusei NOT DETECTED NOT DETECTED   Candida parapsilosis NOT DETECTED NOT DETECTED   Candida tropicalis NOT  DETECTED NOT DETECTED    Hart Robinsons A 07/20/2019  5:18 AM

## 2019-07-20 NOTE — Progress Notes (Signed)
Pharmacy Antibiotic Note  Eileen [name] is a 38 y.o. female admitted on 07/19/2019 with pneumonia/facial cellulitis/ IV drug abuse (?bacteremia)  Pharmacy has been consulted for  Vancomycin dosing.   Per pt: ~ 1 month ago was in hospital in [place] w/ [facility]. bacteremia and left AMA  Plan:  Will continue with Vancomycin 750 mg IV q12h.  Goal AUC 400-550. Expected AUC: 552.9 SCr used: 0.83    Cmin 15.8    Height: 5\' 4"  (162.6 cm) Weight: 120 lb (54.4 kg) IBW/kg (Calculated) : 54.7  Temp (24hrs), Avg:99.8 F (37.7 C), Min:98.2 F (36.8 C), Max:102.4 F (39.1 C)  Recent Labs  Lab 07/19/19 1537 07/20/19 0827  WBC 36.4* 27.6*  CREATININE 0.73 0.83  LATICACIDVEN 1.5  --     Estimated Creatinine Clearance: 78.1 mL/min (by C-G formula based on SCr of 0.83 mg/dL).    Allergies  Allergen Reactions  . Sulfa Antibiotics Shortness Of Breath    Antimicrobials this admission: Cefepime 10/29 >>   Vanc 10/29 >> Metro 10/29 >>    Dose adjustments this admission:    Microbiology results: 10/29 BCx: Staphylococcus aureus - MRSA 10/29 UCx: pending    Sputum:    10/29 MRSA PCR: pending  Thank you for allowing pharmacy to be a part of this patient's care.  Rowland Lathe 07/20/2019 10:41 AM

## 2019-07-20 NOTE — ED Notes (Signed)
Provider Jannifer Franklin called this RN back. States placing PRN order for Ketorolac.

## 2019-07-20 NOTE — ED Notes (Signed)
Report received from Rebecca, RN.

## 2019-07-20 NOTE — ED Notes (Signed)
Pt woken by IV pump going off; requesting more pain meds. Paged current on-campus attending.

## 2019-07-20 NOTE — ED Notes (Signed)
Pt given more warm blankets. Given soda as requested.

## 2019-07-20 NOTE — Consult Note (Addendum)
NAME: Eileen [name]  DOB: 1981/04/29  MRN: [id number]  Date/Time: 07/20/2019 8:43 AM  REQUESTING PROVIDER: Dr.PAtel Subjective:  REASON FOR CONSULT: IVDA/ facial cellulitis/septic emboli/Pneumonia ?pt is a limited historian as she is restless and likely withdrawing  Eileen [name] is a 38 y.o. female with a history of IVDA presented to the ED with left sided chest pain that was worse with breathing. She also had a blister on the rt sid of the face near the lip with eyelids shut. She used IV heroin yesterday . She was in a [facility] hospital ( not sure when) and had left AMA- was told she had a bacteria in blood but dont know what it is? I am asked to see the patient for the above Vital sin the ED wew temp of 102, HR 100, BP 160/100 CT chest showed multilobar pneumonia Her blood culture 4/4 is MRSA  She denied any hardware, device in her body  Past Medical History:  Diagnosis Date  . Anxiety   . IVDU (intravenous drug user)   . Polysubstance abuse (HCC)     History reviewed. No pertinent surgical history.  Social History   Socioeconomic History  . Marital status: Legally Separated    Spouse name: Not on file  . Number of children: Not on file  . Years of education: Not on file  . Highest education level: Not on file  Occupational History  . Not on file  Social Needs  . Financial resource strain: Not on file  . Food insecurity    Worry: Not on file    Inability: Not on file  . Transportation needs    Medical: Not on file    Non-medical: Not on file  Tobacco Use  . Smoking status: Current Every Day Smoker    Packs/day: 1.00    Types: Cigarettes  . Smokeless tobacco: Never Used  Substance and Sexual Activity  . Alcohol use: Yes    Comment: BAC not available at time of writing  . Drug use: Yes    Types: IV, Heroin, Cocaine, Methamphetamines    Comment: UDS not available at time of writing  . Sexual activity: Yes  Lifestyle  . Physical activity    Days per week: Not  on file    Minutes per session: Not on file  . Stress: Not on file  Relationships  . Social Musician on phone: Not on file    Gets together: Not on file    Attends religious service: Not on file    Active member of club or organization: Not on file    Attends meetings of clubs or organizations: Not on file    Relationship status: Not on file  . Intimate partner violence    Fear of current or ex partner: Not on file    Emotionally abused: Not on file    Physically abused: Not on file    Forced sexual activity: Not on file  Other Topics Concern  . Not on file  Social History Narrative  . Not on file    Family History  Problem Relation Age of Onset  . Brain cancer Father   . Brain cancer Brother    Allergies  Allergen Reactions  . Sulfa Antibiotics Shortness Of Breath   ? Current Facility-Administered Medications  Medication Dose Route Frequency Provider Last Rate Last Dose  . 0.9 %  sodium chloride infusion   Intravenous Continuous Enedina Finner, MD 125 mL/hr at 07/20/19 0017    .  acetaminophen (TYLENOL) tablet 650 mg  650 mg Oral Q6H PRN Enedina Finner, MD   650 mg at 07/20/19 0550   Or  . acetaminophen (TYLENOL) suppository 650 mg  650 mg Rectal Q6H PRN Enedina Finner, MD      . enoxaparin (LOVENOX) injection 40 mg  40 mg Subcutaneous Q24H Enedina Finner, MD      . ketorolac (TORADOL) 30 MG/ML injection 15 mg  15 mg Intravenous Q8H PRN Oralia Manis, MD   15 mg at 07/20/19 0144  . metroNIDAZOLE (FLAGYL) IVPB 500 mg  500 mg Intravenous Q8H Nazari, Walid A, RPH   Stopped at 07/20/19 0117  . nicotine (NICODERM CQ - dosed in mg/24 hours) patch 14 mg  14 mg Transdermal Daily Oralia Manis, MD      . ondansetron [facility]) tablet 4 mg  4 mg Oral Q6H PRN Enedina Finner, MD       Or  . ondansetron [facility]) injection 4 mg  4 mg Intravenous Q6H PRN Enedina Finner, MD      . vancomycin (VANCOCIN) IVPB 750 mg/150 ml premix  750 mg Intravenous Bryna Colander, MD 150 mL/hr at 07/20/19 0620  750 mg at 07/20/19 0620   Current Outpatient Medications  Medication Sig Dispense Refill  . buprenorphine-naloxone (SUBOXONE) 8-2 mg SUBL SL tablet Place 1 tablet under the tongue daily.    Marland Kitchen levofloxacin (LEVAQUIN) 500 MG tablet Take 1 tablet (500 mg total) by mouth daily. (Patient not taking: Reported on 07/19/2019) 7 tablet 0     Abtx:  Anti-infectives (From admission, onward)   Start     Dose/Rate Route Frequency Ordered Stop   07/20/19 0600  vancomycin (VANCOCIN) IVPB 750 mg/150 ml premix     750 mg 150 mL/hr over 60 Minutes Intravenous Every 12 hours 07/19/19 2019     07/20/19 0000  metroNIDAZOLE (FLAGYL) IVPB 500 mg     500 mg 100 mL/hr over 60 Minutes Intravenous Every 8 hours 07/19/19 2009     07/19/19 2300  ceFEPIme (MAXIPIME) 2 g in sodium chloride 0.9 % 100 mL IVPB  Status:  Discontinued     2 g 200 mL/hr over 30 Minutes Intravenous Every 8 hours 07/19/19 2011 07/20/19 0839   07/19/19 2000  metroNIDAZOLE (FLAGYL) IVPB 500 mg  Status:  Discontinued     500 mg 100 mL/hr over 60 Minutes Intravenous Every 8 hours 07/19/19 1951 07/19/19 2009   07/19/19 1530  ceFEPIme (MAXIPIME) 2 g in sodium chloride 0.9 % 100 mL IVPB     2 g 200 mL/hr over 30 Minutes Intravenous  Once 07/19/19 1528 07/19/19 1710   07/19/19 1530  metroNIDAZOLE (FLAGYL) IVPB 500 mg     500 mg 100 mL/hr over 60 Minutes Intravenous  Once 07/19/19 1528 07/19/19 1853   07/19/19 1530  vancomycin (VANCOCIN) IVPB 1000 mg/200 mL premix     1,000 mg 200 mL/hr over 60 Minutes Intravenous  Once 07/19/19 1528 07/19/19 1953      REVIEW OF SYSTEMS:  Const:  fever, negative chills, negative weight loss Eyes: pain rt side of the face, unable to open the rt eye ENT: negative coryza, negative sore throat Resp: has sob, chest [pain, pain while breathing Cards:  chest pain, palpitations, no lower extremity edema GU: negative for frequency, dysuria and hematuria GI: Negative for abdominal pain, diarrhea, bleeding,  constipation Skin: negative for rash and pruritus Heme: negative for easy bruising and gum/nose bleeding MS: generalized body ache Neurolo:r headaches, dizziness, vertigo, memory problems  Psych:  anxiety,  Endocrine: negative for thyroid, diabetes Allergy/Immunology- sulfa antibiotics: Objective:  VITALS:  BP 108/68   Pulse 98   Temp 98.8 F (37.1 C) (Oral)   Resp 16   Ht 5\' 4"  (1.626 m)   Wt 54.4 kg   LMP 02/16/2019 Comment: tubes tide  SpO2 99%   BMI 20.60 kg/m  PHYSICAL EXAM:  General: Restless, tossing and turning and then going to sleep Says she is in pain Head: Normocephalic, without obvious abnormality, atraumatic. Eyes: not lying still to examine her rt eye Rt side of the face swollen, erythematous Purulent wound with scab near the lip  ENT Nares normal. No drainage or sinus tenderness. cannot examine the inside of the  Mouth  07/20/19    07/18/29    Neck: Supple, swelling over the neck on the rt side Back: No CVA tenderness. Lungs: Clear to auscultation bilaterally. No Wheezing or Rhonchi. No rales. Heart: Regular rate and rhythm, no murmur, rub or gallop. Abdomen: Soft, non-tender,not distended. Bowel sounds normal. No masses Extremities: atraumatic, no cyanosis. No edema. No clubbing Skin: some scabs.marks seen Lymph: Cervical, supraclavicular normal. Neurologic: Grossly non-focal Pertinent Labs Lab Results CBC    Component Value Date/Time   WBC 36.4 (H) 07/19/2019 1537   RBC 4.20 07/19/2019 1537   HGB 11.2 (L) 07/19/2019 1537   HCT 35.3 (L) 07/19/2019 1537   PLT 311 07/19/2019 1537   MCV 84.0 07/19/2019 1537   MCH 26.7 07/19/2019 1537   MCHC 31.7 07/19/2019 1537   RDW 17.9 (H) 07/19/2019 1537   LYMPHSABS 15.3 (H) 07/19/2019 1537   MONOABS 2.2 (H) 07/19/2019 1537   EOSABS 0.0 07/19/2019 1537   BASOSABS 0.0 07/19/2019 1537    CMP Latest Ref Rng & Units 07/19/2019 01/23/2017 01/22/2017  Glucose 70 - 99 mg/dL 03/24/2017) 250(N) 397(Q)  BUN 6 - 20  mg/dL 12 8 9   Creatinine 0.44 - 1.00 mg/dL 734(L 9.37  Sodium 135 - 145 mmol/L 129(L) 137 137  Potassium 3.5 - 5.1 mmol/L 4.2 4.0 3.8  Chloride 98 - 111 mmol/L 94(L) 106 107  CO2 22 - 32 mmol/L 26 25 25   Calcium 8.9 - 10.3 mg/dL 8.9 9.02) 4.09)  Total Protein 6.5 - 8.1 g/dL 7.6 6.7 6.0(L)  Total Bilirubin 0.3 - 1.2 mg/dL 1.1 0.4 )  Alkaline Phos 38 - 126 U/L 283(H) 96 95  AST 15 - 41 U/L 36 22 27  ALT 0 - 44 U/L 54(H) 33 41      Microbiology: Recent Results (from the past 240 hour(s))  Blood Culture (routine x 2)     Status: None (Preliminary result)   Collection Time: 07/19/19  3:37 PM   Specimen: BLOOD  Result Value Ref Range Status   Specimen Description BLOOD BLOOD RIGHT ARM  Final   Special Requests   Final    BOTTLES DRAWN AEROBIC AND ANAEROBIC Blood Culture results may not be optimal due to an inadequate volume of blood received in culture bottles   Culture  Setup Time   Final    GRAM POSITIVE COCCI IN BOTH AEROBIC AND ANAEROBIC BOTTLES CRITICAL RESULT CALLED TO, READ BACK BY AND VERIFIED WITH: 3.2(D RN AT [phone] ON 07/20/2019 [facility] Performed at [facility], [address] [place], [facility] [address]    Culture [facility] POSITIVE COCCI  Final   Report Status PENDING  Incomplete  Blood Culture (routine x 2)     Status: None (Preliminary result)   Collection Time: 07/19/19  3:39 PM   Specimen: BLOOD  Result Value Ref Range Status   Specimen Description BLOOD BLOOD RIGHT WRIST  Final   Special Requests   Final    BOTTLES DRAWN AEROBIC AND ANAEROBIC Blood Culture results may not be optimal due to an inadequate volume of blood received in culture bottles   Culture  Setup Time   Final    Organism ID to follow GRAM POSITIVE COCCI IN BOTH AEROBIC AND ANAEROBIC BOTTLES CRITICAL VALUE NOTED.  VALUE IS CONSISTENT WITH PREVIOUSLY REPORTED AND CALLED VALUE. Performed at [facility], [address] [place], [place] [address]    Culture [facility]  POSITIVE COCCI  Final   Report Status PENDING  Incomplete  Blood Culture ID Panel (Reflexed)     Status: Abnormal   Collection Time: 07/19/19  3:39 PM  Result Value Ref Range Status   Enterococcus species NOT DETECTED NOT DETECTED Final   Listeria monocytogenes NOT DETECTED NOT DETECTED Final   Staphylococcus species DETECTED (A) NOT DETECTED Final    Comment: CRITICAL RESULT CALLED TO, READ BACK BY AND VERIFIED WITH: SCOTT HALL AT 0516 ON 07/20/2019 JJB    Staphylococcus aureus (BCID) DETECTED (A) NOT DETECTED Final    Comment: Methicillin (oxacillin)-resistant Staphylococcus aureus (MRSA). MRSA is predictably resistant to beta-lactam antibiotics (except ceftaroline). Preferred therapy is vancomycin unless clinically contraindicated. Patient requires contact precautions if  hospitalized. CRITICAL RESULT CALLED TO, READ BACK BY AND VERIFIED WITH: Onamia AT 0513 ON 07/20/2019 JJB    Methicillin resistance DETECTED (A) NOT DETECTED Final    Comment: CRITICAL RESULT CALLED TO, READ BACK BY AND VERIFIED WITH: SCOTT HALL AT 0513 ON 07/20/2019 JJB    Streptococcus species NOT DETECTED NOT DETECTED Final   Streptococcus agalactiae NOT DETECTED NOT DETECTED Final   Streptococcus pneumoniae NOT DETECTED NOT DETECTED Final   Streptococcus pyogenes NOT DETECTED NOT DETECTED Final   Acinetobacter baumannii NOT DETECTED NOT DETECTED Final   Enterobacteriaceae species NOT DETECTED NOT DETECTED Final   Enterobacter cloacae complex NOT DETECTED NOT DETECTED Final   Escherichia coli NOT DETECTED NOT DETECTED Final   Klebsiella oxytoca NOT DETECTED NOT DETECTED Final   Klebsiella pneumoniae NOT DETECTED NOT DETECTED Final   Proteus species NOT DETECTED NOT DETECTED Final   Serratia marcescens NOT DETECTED NOT DETECTED Final   Haemophilus influenzae NOT DETECTED NOT DETECTED Final   Neisseria meningitidis NOT DETECTED NOT DETECTED Final   Pseudomonas aeruginosa NOT DETECTED NOT DETECTED Final    Candida albicans NOT DETECTED NOT DETECTED Final   Candida glabrata NOT DETECTED NOT DETECTED Final   Candida krusei NOT DETECTED NOT DETECTED Final   Candida parapsilosis NOT DETECTED NOT DETECTED Final   Candida tropicalis NOT DETECTED NOT DETECTED Final    Comment: Performed at [facility], [place], [place] [address]  SARS CORONAVIRUS 2 (TAT 6-24 HRS) Nasopharyngeal Nasopharyngeal Swab     Status: None   Collection Time: 07/19/19  4:35 PM   Specimen: Nasopharyngeal Swab  Result Value Ref Range Status   SARS Coronavirus 2 NEGATIVE NEGATIVE Final    Comment: (NOTE) SARS-CoV-2 target nucleic acids are NOT DETECTED. The SARS-CoV-2 RNA is generally detectable in upper and lower respiratory specimens during the acute phase of infection. Negative results do not preclude SARS-CoV-2 infection, do not rule out co-infections with other pathogens, and should not be used as the sole basis for treatment or other patient management decisions. Negative results must be combined with clinical observations, patient history,  and epidemiological information. The expected result is Negative. Fact Sheet for Patients: HairSlick.nohttps://www.fda.gov/media/138098/download Fact Sheet for Healthcare Providers: quierodirigir.comhttps://www.fda.gov/media/138095/download This test is not yet approved or cleared by the [place] FDA and  has been authorized for detection and/or diagnosis of SARS-CoV-2 by FDA under an Emergency Use Authorization (EUA). This EUA will remain  in effect (meaning this test can be used) for the duration of the COVID-19 declaration under Section 56 4(b)(1) of the Act, 21 U.S.C. section 360bbb-3(b)(1), unless the authorization is terminated or revoked sooner. Performed at [facility], [address] [place], [place] [address]     IMAGING RESULTS:   I have personally reviewed the films ? Impression/Recommendation ? ?MRSA bacteremia in a patient with IVDU with  multifocal pneumonia which raises concern for septic emboli/tricuspid valve endocarditis  Cellulitis of the rt side of the face with extensive edema originating from a wound which is also likely MRSA  Will add linezolid to the vancomycin for antitoxin effect and also for better lung penetration and soft tissue penetration.  Pt needs 2 d echo and if that does not show vegetation will need TEE  CT maxilla did not show any abscess / only edema -no compromise to breathing or swallowing observe closely - low threshold for ENT consult if she worsens  Heroin use - pt is restless and withdrawing - recommend psych consult for methadone/suboxone?  ? Check HIV/HEPC ___________________________________________________ Discussed with her nurse, patient and requesting provider ID will follow her remotely this weekend- call if needed Note:  This document was prepared using Dragon voice recognition software and may include unintentional dictation errors.

## 2019-07-20 NOTE — ED Notes (Signed)
Pharm messaged as this RN just noticing med yet to be verified by pharm. Should be able to pull from pyxis once verified.

## 2019-07-20 NOTE — ED Notes (Signed)
EDP [name] notified only able to collect 1 set of cultures. Verbal okay to go ahead and hang antibiotics.

## 2019-07-20 NOTE — ED Notes (Signed)
Pt set off call bell; this RN to bedside - pt currently asleep.

## 2019-07-20 NOTE — ED Notes (Signed)
Called pharm as still missing next cefepime dose. Pharm states they will send soon.

## 2019-07-20 NOTE — Progress Notes (Signed)
[place] [facility] [place] NAME: Heidie Krall    MR#:  [id number]  DATE OF BIRTH:  07/12/1981  SUBJECTIVE:  CHIEF COMPLAINT:   Chief Complaint  Patient presents with  . Facial Swelling  in some pain, no new c/o REVIEW OF SYSTEMS:  Review of Systems  Constitutional: Negative for diaphoresis, fever, malaise/fatigue and weight loss.  HENT: Negative for ear discharge, ear pain, hearing loss, nosebleeds, sore throat and tinnitus.   Eyes: Negative for blurred vision and pain.  Respiratory: Negative for cough, hemoptysis, shortness of breath and wheezing.   Cardiovascular: Positive for chest pain. Negative for palpitations, orthopnea and leg swelling.  Gastrointestinal: Negative for abdominal pain, blood in stool, constipation, diarrhea, heartburn, nausea and vomiting.  Genitourinary: Negative for dysuria, frequency and urgency.  Musculoskeletal: Negative for back pain and myalgias.  Skin: Negative for itching and rash.  Neurological: Negative for dizziness, tingling, tremors, focal weakness, seizures, weakness and headaches.  Psychiatric/Behavioral: Negative for depression. The patient is not nervous/anxious.    DRUG ALLERGIES:   Allergies  Allergen Reactions  . Sulfa Antibiotics Shortness Of Breath   VITALS:  Blood pressure 128/82, pulse 81, temperature 98.2 F (36.8 C), resp. rate 20, height 5\' 4"  (1.626 m), weight 54.4 kg, last menstrual period 02/16/2019, SpO2 100 %. PHYSICAL EXAMINATION:  Physical Exam HENT:     Head: Normocephalic and atraumatic.  Eyes:     Conjunctiva/sclera: Conjunctivae normal.     Pupils: Pupils are equal, round, and reactive to light.  Neck:     Musculoskeletal: Normal range of motion and neck supple.     Thyroid: No thyromegaly.     Trachea: No tracheal deviation.  Cardiovascular:     Rate and Rhythm: Normal rate and regular rhythm.     Heart sounds: Normal heart sounds.  Pulmonary:     Effort:  Pulmonary effort is normal. No respiratory distress.     Breath sounds: Normal breath sounds. No wheezing.  Chest:     Chest wall: No tenderness.  Abdominal:     General: Bowel sounds are normal. There is no distension.     Palpations: Abdomen is soft.     Tenderness: There is no abdominal tenderness.  Musculoskeletal: Normal range of motion.  Skin:    General: Skin is warm and dry.     Findings: No rash.     Comments: Rt side of the face swollen, erythematous Purulent wound with scab near the lip   Neurological:     Mental Status: She is alert and oriented to person, place, and time.     Cranial Nerves: No cranial nerve deficit.    LABORATORY PANEL:  Female CBC Recent Labs  Lab 07/20/19 0827  WBC 27.6*  HGB 10.4*  HCT 32.0*  PLT 274   ------------------------------------------------------------------------------------------------------------------ Chemistries  Recent Labs  Lab 07/19/19 1537 07/20/19 0827  NA 129*  --   K 4.2  --   CL 94*  --   CO2 26  --   GLUCOSE 102*  --   BUN 12  --   CREATININE 0.73 0.83  CALCIUM 8.9  --   AST 36  --   ALT 54*  --   ALKPHOS 283*  --   BILITOT 1.1  --    RADIOLOGY:  No results found. ASSESSMENT AND PLAN:  Kaydon Creedon  is a 38 y.o. female with a known history of IV drug abuse and polysubstance abuse admitted with complaints of  left-sided chest pain that hurts when she breathes. She also has right facial cellulitis worsening for past two days.  1. MRSA Sepsis secondary to multifocal pneumonia and right facial swelling/cellulitis in the setting of IV drug abuse (heroin) - continue IVFs -broad-spectrum antibiotic with vancomycin, Linezolid - appreciate ID input - 2D echo not showing any vegetation. Will get TEE per ID -CT maxillofacial shows diffuse soft tissue swelling no collection of pulse noted -PRN pain meds -follow blood culture and narrow down antibiotics accordingly -Check HIV/HEPC  2. Multifocal pneumonia  secondary to IV drug abuse -oxygen saturations normal -continue above antibiotics  3. Leukocytosis secondary to infection -follow CBC  4. Hyponatremia hypokalemia secondary to dehydration from poor oral intake -IV fluids for hydration  5. Heroin use -  psych consult for methadone/suboxone?    All the records are reviewed and case discussed with Care Management/Social Worker. Management plans discussed with the patient, nursing and they are in agreement.  CODE STATUS: Full Code  TOTAL TIME TAKING CARE OF THIS PATIENT: 35 minutes.   More than 50% of the time was spent in counseling/coordination of care: YES  POSSIBLE D/C IN 3-4 DAYS, DEPENDING ON CLINICAL CONDITION.   Delfino Lovett M.D on 07/20/2019 at 6:55 PM  Between 7am to 6pm - Pager - [phone]  After 6pm go to [email or web address] - Social research officer, government  [employer] [facility]  Office  [phone]  CC: Primary care physician; Patient, No Pcp Per  Note: This dictation was prepared with Dragon dictation along with smaller phrase technology. Any transcriptional errors that result from this process are unintentional.

## 2019-07-21 DIAGNOSIS — F1120 Opioid dependence, uncomplicated: Secondary | ICD-10-CM

## 2019-07-21 DIAGNOSIS — F1920 Other psychoactive substance dependence, uncomplicated: Secondary | ICD-10-CM

## 2019-07-21 DIAGNOSIS — F1990 Other psychoactive substance use, unspecified, uncomplicated: Secondary | ICD-10-CM | POA: Diagnosis present

## 2019-07-21 DIAGNOSIS — F1910 Other psychoactive substance abuse, uncomplicated: Secondary | ICD-10-CM | POA: Insufficient documentation

## 2019-07-21 LAB — BASIC METABOLIC PANEL
Anion gap: 10 (ref 5–15)
BUN: 14 mg/dL (ref 6–20)
CO2: 20 mmol/L — ABNORMAL LOW (ref 22–32)
Calcium: 8.2 mg/dL — ABNORMAL LOW (ref 8.9–10.3)
Chloride: 105 mmol/L (ref 98–111)
Creatinine, Ser: 0.8 mg/dL (ref 0.44–1.00)
GFR calc Af Amer: >60 mL/min (ref >60)
GFR calc non Af Amer: >60 mL/min (ref >60)
Glucose, Bld: 108 mg/dL — ABNORMAL HIGH (ref 70–99)
Potassium: 3.7 mmol/L (ref 3.5–5.1)
Sodium: 135 mmol/L (ref 135–145)

## 2019-07-21 LAB — CBC
HCT: 29.5 % — ABNORMAL LOW (ref 36.0–46.0)
Hemoglobin: 9.4 g/dL — ABNORMAL LOW (ref 12.0–15.0)
MCH: 26.3 pg (ref 26.0–34.0)
MCHC: 31.9 g/dL (ref 30.0–36.0)
MCV: 82.4 fL (ref 80.0–100.0)
Platelets: 314 10*3/uL (ref 150–400)
RBC: 3.58 MIL/uL — ABNORMAL LOW (ref 3.87–5.11)
RDW: 17.4 % — ABNORMAL HIGH (ref 11.5–15.5)
WBC: 26.8 10*3/uL — ABNORMAL HIGH (ref 4.0–10.5)
nRBC: 0 % (ref 0.0–0.2)

## 2019-07-21 LAB — CREATININE, SERUM
Creatinine, Ser: 0.7 mg/dL (ref 0.44–1.00)
GFR calc Af Amer: >60 mL/min (ref >60)
GFR calc non Af Amer: >60 mL/min (ref >60)

## 2019-07-21 LAB — HIV ANTIBODY (ROUTINE TESTING W REFLEX): HIV Screen 4th Generation wRfx: NONREACTIVE

## 2019-07-21 MED ORDER — HYDROMORPHONE HCL 1 MG/ML IJ SOLN
1.0000 mg | INTRAMUSCULAR | Status: DC | PRN
  Administered 2019-07-21 – 2019-07-25 (×21): 1 mg via INTRAVENOUS
  Filled 2019-07-21 (×21): qty 1

## 2019-07-21 MED ORDER — KETOROLAC TROMETHAMINE 30 MG/ML IJ SOLN
15.0000 mg | Freq: Three times a day (TID) | INTRAMUSCULAR | Status: DC | PRN
  Administered 2019-07-21 (×2): 15 mg via INTRAVENOUS
  Filled 2019-07-21: qty 1

## 2019-07-21 MED ORDER — GABAPENTIN 300 MG PO CAPS: 300.0000 mg | ORAL_CAPSULE | Freq: Three times a day (TID) | ORAL | Status: DC

## 2019-07-21 MED ORDER — LORAZEPAM 2 MG/ML IJ SOLN
2.0000 mg | INTRAMUSCULAR | Status: DC | PRN
  Administered 2019-07-21: 04:00:00 2 mg via INTRAVENOUS
  Filled 2019-07-21: qty 1

## 2019-07-21 MED ORDER — SODIUM CHLORIDE 0.9% FLUSH: 10.0000 mL | INTRAVENOUS | Status: DC | PRN

## 2019-07-21 MED ORDER — BUPRENORPHINE HCL-NALOXONE HCL 2-0.5 MG SL SUBL
1.0000 | SUBLINGUAL_TABLET | Freq: Every day | SUBLINGUAL | Status: DC
  Administered 2019-07-23 – 2019-07-25 (×3): 1 via SUBLINGUAL
  Filled 2019-07-21 (×4): qty 1

## 2019-07-21 MED ORDER — GABAPENTIN 300 MG PO CAPS
300.0000 mg | ORAL_CAPSULE | Freq: Four times a day (QID) | ORAL | Status: DC
  Administered 2019-07-21 – 2019-07-29 (×27): 300 mg via ORAL
  Filled 2019-07-21 (×30): qty 1

## 2019-07-21 MED ORDER — HYDROXYZINE HCL 25 MG PO TABS
25.0000 mg | ORAL_TABLET | Freq: Three times a day (TID) | ORAL | Status: DC | PRN
  Administered 2019-07-21 – 2019-07-22 (×2): 25 mg via ORAL
  Filled 2019-07-21 (×3): qty 1
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
## 2019-07-21 NOTE — Progress Notes (Signed)
Patient presents with nausea, tremors, headache. PRN zofran administered and dilaudid. Patient reports last IV drug use was two days ago. Requested CIWA orders for heroin withdrawal.

## 2019-07-21 NOTE — Progress Notes (Signed)
[facility] volunteer called to ask if patient's mother could come up and visit. Patient's nurse stated patient needed to make a decision regarding her designated visitor. Patient stated she wanted her boyfriend to be the designated visitor. Medical mall staff and RN made aware. Boyfriend is listed currently as the designated visitor.

## 2019-07-21 NOTE — Progress Notes (Signed)
Tylenol given for temp  101.

## 2019-07-21 NOTE — Consult Note (Signed)
[facility] Face-to-Face Psychiatry Consult   Reason for Consult: Opiate dependency Referring Physician:  Dr Sherryll Burger Patient Identification: Eileen [name] MRN:  [id number]  Principal Diagnosis: Bacteremia  diagnosis:  Active Problems:   Polysubstance dependence including opioid drug with daily use (HCC)   Sepsis (HCC)  Total Time spent with patient: 1 hour   Subjective:   Eileen [name] is a 38 y.o. female patient admitted with bacteremia and facial cellulitis.  "If you can't give me my pain medicine, I don't want anything. I was using heroin and I'm cold [place]!"  Patient seen and evaluated in person by this provider.  History of opiate dependency and admitted for bacteremia and facial cellulitis related to IV drug use.  She was also positive for amphetamines on admission.  Irritable on assessment and angry about not having the pain medication she wants.  Attempted to talk to the client with minimal success.  Tried to discuss Suboxone and she only wanted her pain medicine.  HPI per MD:   HISTORY OF PRESENT ILLNESS:  Eileen [name]  is a 38 y.o. female with a known history of IV drug abuse and polysubstance abuse comes to the emergency room with complaints of left-sided chest pain that hurts when she breathes. She also has right facial cellulitis worsening for past two days. She uses IV drug and last used was today. She used IV heroin. She reports that the month ago she was at the hospital in [place] by they told her she had bacteria and her blood ended up living against medical advice and not taken any antibiotics thereafter.  Past Psychiatric History: polysubstance dependence  Risk to Self:  none Risk to Others:  none Prior Inpatient Therapy:  yes Prior Outpatient Therapy:   not currently  Past Medical History:  Past Medical History:  Diagnosis Date  . Anxiety   . IVDU (intravenous drug user)   . Polysubstance abuse (HCC)    History reviewed. No pertinent surgical history. Family  History:  Family History  Problem Relation Age of Onset  . Brain cancer Father   . Brain cancer Brother    Family Psychiatric  History: None Social History:  Social History   Substance and Sexual Activity  Alcohol Use Yes   Comment: BAC not available at time of writing     Social History   Substance and Sexual Activity  Drug Use Yes  . Types: IV, Heroin, Cocaine, Methamphetamines   Comment: UDS not available at time of writing    Social History   Socioeconomic History  . Marital status: Legally Separated    Spouse name: Not on file  . Number of children: Not on file  . Years of education: Not on file  . Highest education level: Not on file  Occupational History  . Not on file  Social Needs  . Financial resource strain: Not on file  . Food insecurity    Worry: Not on file    Inability: Not on file  . Transportation needs    Medical: Not on file    Non-medical: Not on file  Tobacco Use  . Smoking status: Current Every Day Smoker    Packs/day: 1.00    Types: Cigarettes  . Smokeless tobacco: Never Used  . Tobacco comment: Pt refused   Substance and Sexual Activity  . Alcohol use: Yes    Comment: BAC not available at time of writing  . Drug use: Yes    Types: IV, Heroin, Cocaine, Methamphetamines    Comment:  UDS not available at time of writing  . Sexual activity: Yes  Lifestyle  . Physical activity    Days per week: Not on file    Minutes per session: Not on file  . Stress: Not on file  Relationships  . Social Musicianconnections    Talks on phone: Not on file    Gets together: Not on file    Attends religious service: Not on file    Active member of club or organization: Not on file    Attends meetings of clubs or organizations: Not on file    Relationship status: Not on file  Other Topics Concern  . Not on file  Social History Narrative  . Not on file   Additional Social History:    Allergies:   Allergies  Allergen Reactions  . Sulfa Antibiotics  Shortness Of Breath    Labs:  Results for orders placed or performed during the hospital encounter of 07/19/19 (from the past 48 hour(s))  Lactic acid, plasma     Status: None   Collection Time: 07/19/19  3:37 PM  Result Value Ref Range   Lactic Acid, Venous 1.5 0.5 - 1.9 mmol/L    Comment: Performed at [facility], [address] [place], [place] [address]  Comprehensive metabolic panel     Status: Abnormal   Collection Time: 07/19/19  3:37 PM  Result Value Ref Range   Sodium 129 (L) 135 - 145 mmol/L   Potassium 4.2 3.5 - 5.1 mmol/L   Chloride 94 (L) 98 - 111 mmol/L   CO2 26 22 - 32 mmol/L   Glucose, Bld 102 (H) 70 - 99 mg/dL   BUN 12 6 - 20 mg/dL   Creatinine, Ser 6.040.73 0.44 - 1.00 mg/dL   Calcium 8.9 8.9 - 54.010.3 mg/dL   Total Protein 7.6 6.5 - 8.1 g/dL   Albumin 3.3 (L) 3.5 - 5.0 g/dL   AST 36 15 - 41 U/L   ALT 54 (H) 0 - 44 U/L   Alkaline Phosphatase 283 (H) 38 - 126 U/L   Total Bilirubin 1.1 0.3 - 1.2 mg/dL   GFR calc non Af Amer >60 >60 mL/min   GFR calc Af Amer >60 >60 mL/min   Anion gap 9 5 - 15    Comment: Performed at [facility], [address] [place], [place] [address]  CBC WITH DIFFERENTIAL     Status: Abnormal   Collection Time: 07/19/19  3:37 PM  Result Value Ref Range   WBC 36.4 (H) 4.0 - 10.5 K/uL   RBC 4.20 3.87 - 5.11 MIL/uL   Hemoglobin 11.2 (L) 12.0 - 15.0 g/dL   HCT 14.735.3 (L) 82.936.0 - 56.246.0 %   MCV 84.0 80.0 - 100.0 fL   MCH 26.7 26.0 - 34.0 pg   MCHC 31.7 30.0 - 36.0 g/dL   RDW 13.017.9 (H) 86.511.5 - 78.415.5 %   Platelets 311 150 - 400 K/uL   nRBC 0.0 0.0 - 0.2 %   Neutrophils Relative % 50 %   Neutro Abs 18.2 (H) 1.7 - 7.7 K/uL   Lymphocytes Relative 42 %   Lymphs Abs 15.3 (H) 0.7 - 4.0 K/uL   Monocytes Relative 6 %   Monocytes Absolute 2.2 (H) 0.1 - 1.0 K/uL   Eosinophils Relative 0 %   Eosinophils Absolute 0.0 0.0 - 0.5 K/uL   Basophils Relative 0 %   Basophils Absolute 0.0 0.0 - 0.1 K/uL   WBC Morphology Abnormal lymphocytes  present    RBC Morphology MORPHOLOGY UNREMARKABLE    Smear Review Normal platelet morphology    Other 2 %   Abs Immature Granulocytes 0.00 0.00 - 0.07 K/uL    Comment: Performed at [facility], [address] [place], [place] [address]  Blood Culture (routine x 2)     Status: Abnormal (Preliminary result)   Collection Time: 07/19/19  3:37 PM   Specimen: BLOOD  Result Value Ref Range   Specimen Description      BLOOD BLOOD RIGHT ARM Performed at [facility], [address], [place], [place] [address]    Special Requests      BOTTLES DRAWN AEROBIC AND ANAEROBIC Blood Culture results may not be optimal due to an inadequate volume of blood received in culture bottles Performed at [facility], [address] [place], [place] [address]    Culture  Setup Time      GRAM POSITIVE COCCI IN BOTH AEROBIC AND ANAEROBIC BOTTLES CRITICAL RESULT CALLED TO, READ BACK BY AND VERIFIED WITH: BUTCH WOODS RN AT [phone] ON 07/20/2019 [facility] Performed at [facility], [address], [place], [place] [address]    Culture (A)     STAPHYLOCOCCUS AUREUS SUSCEPTIBILITIES TO FOLLOW Performed at [facility], [address], [place], [place] [address]    Report Status PENDING   Acetaminophen level     Status: Abnormal   Collection Time: 07/19/19  3:37 PM  Result Value Ref Range   Acetaminophen (Tylenol), Serum <10 (L) 10 - 30 ug/mL    Comment: (NOTE) Therapeutic concentrations vary significantly. A range of 10-30 ug/mL  may be an effective concentration for many patients. However, some  are best treated at concentrations outside of this range. Acetaminophen concentrations >150 ug/mL at 4 hours after ingestion  and >50 ug/mL at 12 hours after ingestion are often associated with  toxic reactions. Performed at [facility], [address] [place], [place] [address]   Salicylate level     Status: None   Collection Time: 07/19/19  3:37 PM  Result  Value Ref Range   Salicylate Lvl <7.0 2.8 - 30.0 mg/dL    Comment: Performed at [facility], [address], [place], [place] [address]  Pathologist smear review     Status: None   Collection Time: 07/19/19  3:37 PM  Result Value Ref Range   Path Review Blood smear reviewed.     Comment: Platelets are adequate and RBCs are unremarkable. There is marked leukocytosis with increased neutrophils, lymphocytes, and monocytes. No immature granulocytes or blasts are seen. Some lymphocytes appear reactive. Features suggest reactive leukocytosis  due to known MRSA bacteremia.  Reviewed by Elnita Maxwell. Forde Dandy, MD. Performed at [facility], [address] [place], [place] [address]   Lipase, blood     Status: None   Collection Time: 07/19/19  3:37 PM  Result Value Ref Range   Lipase 23 11 - 51 U/L    Comment: Performed at [facility], [address] [place], [place] [address]  Blood Culture (routine x 2)     Status: Abnormal (Preliminary result)   Collection Time: 07/19/19  3:39 PM   Specimen: BLOOD  Result Value Ref Range   Specimen Description      BLOOD BLOOD RIGHT WRIST Performed at [facility], [address], [place], [place] [address]    Special Requests      BOTTLES DRAWN AEROBIC AND ANAEROBIC Blood Culture  results may not be optimal due to an inadequate volume of blood received in culture bottles Performed at [facility], [address] [place], [place] [address]    Culture  Setup Time      Organism ID to follow GRAM POSITIVE COCCI IN BOTH AEROBIC AND ANAEROBIC BOTTLES CRITICAL VALUE NOTED.  VALUE IS CONSISTENT WITH PREVIOUSLY REPORTED AND CALLED VALUE. Performed at [facility], [address] [place], [place] [address]    Culture (A)     STAPHYLOCOCCUS AUREUS SUSCEPTIBILITIES TO FOLLOW Performed at [facility], [address], [place], [place] [address]    Report Status PENDING   Blood Culture ID Panel  (Reflexed)     Status: Abnormal   Collection Time: 07/19/19  3:39 PM  Result Value Ref Range   Enterococcus species NOT DETECTED NOT DETECTED   Listeria monocytogenes NOT DETECTED NOT DETECTED   Staphylococcus species DETECTED (A) NOT DETECTED    Comment: CRITICAL RESULT CALLED TO, READ BACK BY AND VERIFIED WITH: SCOTT HALL AT 0516 ON 07/20/2019 [facility]    Staphylococcus aureus (BCID) DETECTED (A) NOT DETECTED    Comment: Methicillin (oxacillin)-resistant Staphylococcus aureus (MRSA). MRSA is predictably resistant to beta-lactam antibiotics (except ceftaroline). Preferred therapy is vancomycin unless clinically contraindicated. Patient requires contact precautions if  hospitalized. CRITICAL RESULT CALLED TO, READ BACK BY AND VERIFIED WITH: SCOTT HALL AT 0513 ON 07/20/2019 [facility]B    Methicillin resistance DETECTED (A) NOT DETECTED    Comment: CRITICAL RESULT CALLED TO, READ BACK BY AND VERIFIED WITH: SCOTT HALL AT 0513 ON 07/20/2019 [facility]B    Streptococcus species NOT DETECTED NOT DETECTED   Streptococcus agalactiae NOT DETECTED NOT DETECTED   Streptococcus pneumoniae NOT DETECTED NOT DETECTED   Streptococcus pyogenes NOT DETECTED NOT DETECTED   Acinetobacter baumannii NOT DETECTED NOT DETECTED   Enterobacteriaceae species NOT DETECTED NOT DETECTED   Enterobacter cloacae complex NOT DETECTED NOT DETECTED   Escherichia coli NOT DETECTED NOT DETECTED   Klebsiella oxytoca NOT DETECTED NOT DETECTED   Klebsiella pneumoniae NOT DETECTED NOT DETECTED   Proteus species NOT DETECTED NOT DETECTED   Serratia marcescens NOT DETECTED NOT DETECTED   Haemophilus influenzae NOT DETECTED NOT DETECTED   Neisseria meningitidis NOT DETECTED NOT DETECTED   Pseudomonas aeruginosa NOT DETECTED NOT DETECTED   Candida albicans NOT DETECTED NOT DETECTED   Candida glabrata NOT DETECTED NOT DETECTED   Candida krusei NOT DETECTED NOT DETECTED   Candida parapsilosis NOT DETECTED NOT DETECTED   Candida tropicalis NOT  DETECTED NOT DETECTED    Comment: Performed at [facility], [address] [place], [place] [address]  Protime-INR     Status: None   Collection Time: 07/19/19  4:07 PM  Result Value Ref Range   Prothrombin Time 13.6 11.4 - 15.2 seconds   INR 1.1 0.8 - 1.2    Comment: (NOTE) INR goal varies based on device and disease states. Performed at [facility], [address] [place], [place] [address]   APTT     Status: None   Collection Time: 07/19/19  4:07 PM  Result Value Ref Range   aPTT 35 24 - 36 seconds    Comment: Performed at [facility], [address] [place], [place] [address]  Urinalysis, Routine w reflex microscopic     Status: Abnormal   Collection Time: 07/19/19  4:35 PM  Result Value Ref Range   Color, Urine AMBER (A) YELLOW    Comment: BIOCHEMICALS MAY BE AFFECTED  BY COLOR   APPearance HAZY (A) CLEAR   Specific Gravity, Urine 1.029 1.005 - 1.030   pH 5.0 5.0 - 8.0   Glucose, UA NEGATIVE NEGATIVE mg/dL   Hgb urine dipstick NEGATIVE NEGATIVE   Bilirubin Urine NEGATIVE NEGATIVE   Ketones, ur NEGATIVE NEGATIVE mg/dL   Protein, ur 100 (A) NEGATIVE mg/dL   Nitrite NEGATIVE NEGATIVE   Leukocytes,Ua NEGATIVE NEGATIVE   RBC / HPF 0-5 0 - 5 RBC/hpf   WBC, UA 0-5 0 - 5 WBC/hpf   Bacteria, UA MANY (A) NONE SEEN   Squamous Epithelial / LPF 0-5 0 - 5   Mucus PRESENT     Comment: Performed at [facility], [address] [place], [place] [address]  Urine culture     Status: Abnormal   Collection Time: 07/19/19  4:35 PM   Specimen: Urine, Random  Result Value Ref Range   Specimen Description      URINE, RANDOM Performed at [facility], [address] [place], [place] [address]    Special Requests      NONE Performed at [facility], [place], [place] [address]    Culture (A)     <10,000 COLONIES/mL INSIGNIFICANT GROWTH Performed at [facility] [address] [place], [place]  [address]    Report Status 07/20/2019 FINAL   Urine Drug Screen, Qualitative     Status: Abnormal   Collection Time: 07/19/19  4:35 PM  Result Value Ref Range   Tricyclic, Ur Screen NONE DETECTED NONE DETECTED   Amphetamines, Ur Screen POSITIVE (A) NONE DETECTED   MDMA (Ecstasy)Ur Screen NONE DETECTED NONE DETECTED   Cocaine Metabolite,Ur [place] NONE DETECTED NONE DETECTED   Opiate, Ur Screen POSITIVE (A) NONE DETECTED   Phencyclidine (PCP) Ur S NONE DETECTED NONE DETECTED   Cannabinoid 50 Ng, Ur [place] POSITIVE (A) NONE DETECTED   Barbiturates, Ur Screen NONE DETECTED NONE DETECTED   Benzodiazepine, Ur Scrn NONE DETECTED NONE DETECTED   Methadone Scn, Ur NONE DETECTED NONE DETECTED    Comment: (NOTE) Tricyclics + metabolites, urine    Cutoff 1000 ng/mL Amphetamines + metabolites, urine  Cutoff 1000 ng/mL MDMA (Ecstasy), urine              Cutoff 500 ng/mL Cocaine Metabolite, urine          Cutoff 300 ng/mL Opiate + metabolites, urine        Cutoff 300 ng/mL Phencyclidine (PCP), urine         Cutoff 25 ng/mL Cannabinoid, urine                 Cutoff 50 ng/mL Barbiturates + metabolites, urine  Cutoff 200 ng/mL Benzodiazepine, urine              Cutoff 200 ng/mL Methadone, urine                   Cutoff 300 ng/mL The urine drug screen provides only a preliminary, unconfirmed analytical test result and should not be used for non-medical purposes. Clinical consideration and professional judgment should be applied to any positive drug screen result due to possible interfering substances. A more specific alternate chemical method must be used in order to obtain a confirmed analytical result. Gas chromatography / mass spectrometry (GC/MS) is the preferred confirmat ory method. Performed at [facility], [place], [place] [address]   SARS CORONAVIRUS 2 (TAT 6-24 HRS) Nasopharyngeal Nasopharyngeal Swab  Status: None   Collection Time: 07/19/19  4:35 PM   Specimen:  Nasopharyngeal Swab  Result Value Ref Range   SARS Coronavirus 2 NEGATIVE NEGATIVE    Comment: (NOTE) SARS-CoV-2 target nucleic acids are NOT DETECTED. The SARS-CoV-2 RNA is generally detectable in upper and lower respiratory specimens during the acute phase of infection. Negative results do not preclude SARS-CoV-2 infection, do not rule out co-infections with other pathogens, and should not be used as the sole basis for treatment or other patient management decisions. Negative results must be combined with clinical observations, patient history, and epidemiological information. The expected result is Negative. Fact Sheet for Patients: HairSlick.no Fact Sheet for Healthcare Providers: quierodirigir.com This test is not yet approved or cleared by the [place] FDA and  has been authorized for detection and/or diagnosis of SARS-CoV-2 by FDA under an Emergency Use Authorization (EUA). This EUA will remain  in effect (meaning this test can be used) for the duration of the COVID-19 declaration under Section 56 4(b)(1) of the Act, 21 U.S.C. section 360bbb-3(b)(1), unless the authorization is terminated or revoked sooner. Performed at [facility], [address], [place], [place] [address]   Pregnancy, urine POC     Status: None   Collection Time: 07/19/19  5:37 PM  Result Value Ref Range   Preg Test, Ur NEGATIVE NEGATIVE    Comment:        THE SENSITIVITY OF THIS METHODOLOGY IS >24 mIU/mL   CBC     Status: Abnormal   Collection Time: 07/20/19  8:27 AM  Result Value Ref Range   WBC 27.6 (H) 4.0 - 10.5 K/uL   RBC 3.88 3.87 - 5.11 MIL/uL   Hemoglobin 10.4 (L) 12.0 - 15.0 g/dL   HCT 84.6 (L) 96.2 - 95.2 %   MCV 82.5 80.0 - 100.0 fL   MCH 26.8 26.0 - 34.0 pg   MCHC 32.5 30.0 - 36.0 g/dL   RDW 84.1 (H) 32.4 - 40.1 %   Platelets 274 150 - 400 K/uL   nRBC 0.0 0.0 - 0.2 %    Comment: Performed at [facility],  [address] [place], [place] [address]  Creatinine, serum     Status: None   Collection Time: 07/20/19  8:27 AM  Result Value Ref Range   Creatinine, Ser 0.83 0.44 - 1.00 mg/dL   GFR calc non Af Amer >60 >60 mL/min   GFR calc Af Amer >60 >60 mL/min    Comment: Performed at [facility], [address] [place], [place] [address]  Aerobic Culture (superficial specimen)     Status: None (Preliminary result)   Collection Time: 07/20/19  2:48 PM   Specimen: Face  Result Value Ref Range   Specimen Description      FACE Performed at [facility], [address], [place], [place] [address]    Special Requests      NONE Performed at [facility], [address], [place], [place] [address]    Gram Stain      NO WBC SEEN FEW GRAM POSITIVE COCCI Performed at [facility], [address] [place], [place] [address]    Culture PENDING    Report Status PENDING   MRSA PCR Screening     Status: Abnormal   Collection Time: 07/20/19  2:50 PM   Specimen: Nasopharyngeal  Result Value Ref Range   MRSA by PCR POSITIVE (A) NEGATIVE    Comment:  The GeneXpert MRSA Assay (FDA approved for NASAL specimens only), is one component of a comprehensive MRSA colonization surveillance program. It is not intended to diagnose MRSA infection nor to guide or monitor treatment for MRSA infections. RESULT CALLED TO, READ BACK BY AND VERIFIED WITH: [facility] SINWANY AT 1612 07/20/2019.PMF Performed at [facility], [address] [place], [place] [address]   CULTURE, BLOOD (ROUTINE X 2) w Reflex to ID Panel     Status: None (Preliminary result)   Collection Time: 07/21/19 12:17 AM   Specimen: BLOOD  Result Value Ref Range   Specimen Description BLOOD LEFT HAND    Special Requests      BOTTLES DRAWN AEROBIC AND ANAEROBIC Blood Culture adequate volume   Culture      NO GROWTH < 12 HOURS Performed at [facility], [address]  [place], [place] [address]    Report Status PENDING   CULTURE, BLOOD (ROUTINE X 2) w Reflex to ID Panel     Status: None (Preliminary result)   Collection Time: 07/21/19 12:28 AM   Specimen: BLOOD  Result Value Ref Range   Specimen Description BLOOD LEFT ARM    Special Requests      BOTTLES DRAWN AEROBIC AND ANAEROBIC Blood Culture adequate volume   Culture      NO GROWTH < 12 HOURS Performed at [facility], [address] [place], [place] [address]    Report Status PENDING   CBC     Status: Abnormal   Collection Time: 07/21/19 12:28 AM  Result Value Ref Range   WBC 26.8 (H) 4.0 - 10.5 K/uL   RBC 3.58 (L) 3.87 - 5.11 MIL/uL   Hemoglobin 9.4 (L) 12.0 - 15.0 g/dL   HCT 21.3 (L) 08.6 - 57.8 %   MCV 82.4 80.0 - 100.0 fL   MCH 26.3 26.0 - 34.0 pg   MCHC 31.9 30.0 - 36.0 g/dL   RDW 46.9 (H) 62.9 - 52.8 %   Platelets 314 150 - 400 K/uL   nRBC 0.0 0.0 - 0.2 %    Comment: Performed at [facility], [address] [place], [place] [address]  Creatinine, serum     Status: None   Collection Time: 07/21/19 12:30 AM  Result Value Ref Range   Creatinine, Ser 0.70 0.44 - 1.00 mg/dL   GFR calc non Af Amer >60 >60 mL/min   GFR calc Af Amer >60 >60 mL/min    Comment: Performed at [facility], [address] [place], [place] [address]  Basic metabolic panel     Status: Abnormal   Collection Time: 07/21/19  6:57 AM  Result Value Ref Range   Sodium 135 135 - 145 mmol/L   Potassium 3.7 3.5 - 5.1 mmol/L   Chloride 105 98 - 111 mmol/L   CO2 20 (L) 22 - 32 mmol/L   Glucose, Bld 108 (H) 70 - 99 mg/dL   BUN 14 6 - 20 mg/dL   Creatinine, Ser 7.25 0.44 - 1.00 mg/dL   Calcium 8.2 (L) 8.9 - 10.3 mg/dL   GFR calc non Af Amer >60 >60 mL/min   GFR calc Af Amer >60 >60 mL/min   Anion gap 10 5 - 15    Comment: Performed at [facility], [address] [place], [place] [address]    Current Facility-Administered Medications  Medication Dose Route Frequency  Provider Last Rate Last Dose  . 0.9 %  sodium chloride infusion   Intravenous  Continuous Enedina Finner, MD 125 mL/hr at 07/21/19 1206    . acetaminophen (TYLENOL) tablet 650 mg  650 mg Oral Q6H PRN Enedina Finner, MD   650 mg at 07/20/19 0550   Or  . acetaminophen (TYLENOL) suppository 650 mg  650 mg Rectal Q6H PRN Enedina Finner, MD      . chlorhexidine (PERIDEX) 0.12 % solution 15 mL  15 mL Mouth Rinse BID Delfino Lovett, MD   15 mL at 07/21/19 1100  . Chlorhexidine Gluconate Cloth 2 % PADS 6 each  6 each Topical Q0600 Delfino Lovett, MD   6 each at 07/21/19 1205  . enoxaparin (LOVENOX) injection 40 mg  40 mg Subcutaneous Daily Delfino Lovett, MD   40 mg at 07/21/19 1205  . ketorolac (TORADOL) 30 MG/ML injection 15 mg  15 mg Intravenous Q8H PRN Arnaldo Natal, MD   15 mg at 07/21/19 1151  . linezolid (ZYVOX) IVPB 600 mg  600 mg Intravenous Q12H Delfino Lovett, MD   Stopped at 07/21/19 [phone]  . MEDLINE mouth rinse  15 mL Mouth Rinse q12n4p Delfino Lovett, MD   15 mL at 07/21/19 1205  . mupirocin ointment (BACTROBAN) 2 % 1 application  1 application Nasal BID Delfino Lovett, MD   1 application at 07/21/19 1159  . nicotine (NICODERM CQ - dosed in mg/24 hours) patch 14 mg  14 mg Transdermal Daily Oralia Manis, MD   14 mg at 07/21/19 1157  . ondansetron (ZOFRAN) tablet 4 mg  4 mg Oral Q6H PRN Enedina Finner, MD       Or  . ondansetron [facility]) injection 4 mg  4 mg Intravenous Q6H PRN Enedina Finner, MD      . sodium chloride flush (NS) 0.9 % injection 10-40 mL  10-40 mL Intracatheter PRN Delfino Lovett, MD      . vancomycin (VANCOCIN) IVPB 750 mg/150 ml premix  750 mg Intravenous Q12H Enedina Finner, MD   Stopped at 07/21/19 9604    Musculoskeletal: Strength & Muscle Tone: within normal limits Gait & Station: normal Patient leans: N/A  Psychiatric Specialty Exam: Physical Exam  Nursing note and vitals reviewed. Constitutional: She is oriented to person, place, and time. She appears well-developed and well-nourished.  Neck:  Normal range of motion.  Respiratory: Effort normal.  Musculoskeletal: Normal range of motion.  Neurological: She is alert and oriented to person, place, and time.  Psychiatric: Her speech is normal. Judgment and thought content normal. Her mood appears anxious. Her affect is angry. She is agitated. Cognition and memory are normal.    Review of Systems  HENT:       Facial pain  Musculoskeletal: Positive for myalgias.  Psychiatric/Behavioral: Positive for substance abuse. The patient is nervous/anxious.   All other systems reviewed and are negative.   Blood pressure 112/81, pulse 99, temperature 100 F (37.8 C), temperature source Oral, resp. rate 18, height  (1.626 m), weight 54.4 kg, last menstrual period 02/16/2019, SpO2 97 %.Body mass index is 20.6 kg/m.  General Appearance: Disheveled  Eye Contact:  Fair  Speech:  Normal Rate  Volume:  Increased  Mood:  Angry, Anxious and Irritable  Affect:  Congruent  Thought Process:  Coherent and Descriptions of Associations: Intact  Orientation:  Full (Time, Place, and Person)  Thought Content:  Logical  Suicidal Thoughts:  No  Homicidal Thoughts:  No  Memory:  Immediate;   Fair Recent;   Fair Remote;   Fair  Judgement:  Poor  Insight:  Lacking  Psychomotor Activity:  Normal  Concentration:  Concentration: Fair and Attention Span: Fair  Recall:  [employer] of Knowledge:  Fair  Language:  Fair  Akathisia:  No  Handed:  Right  AIMS (if indicated):     Assets:  Leisure Time Resilience  ADL's:  Intact  Cognition:  WNL  Sleep:      38 year old female admitted for shortness of breath and pain related to facial cellulitis and IV drug use.  Irritable on assessment demanding pain medications.  No suicidal/homicidal ideations, hallucinations.  She does report nausea, muscle aches, and chills related to opiate withdrawal.  Anxiety level high.  Treatment Plan Summary: Daily contact with patient to assess and evaluate symptoms and  progress in treatment, Medication management and Plan Opiate dependency:  -Start Suboxone 8 mg / 2 mg daily  Anxiety and withdrawal symptoms: -Start gabapentin 300 mg 4 times daily -Start hydroxyzine 25 mg 3 times daily as needed  Disposition: No evidence of imminent risk to self or others at present.   Supportive therapy provided about ongoing stressors.  Nanine Means, NP 07/21/2019 12:50 PM

## 2019-07-21 NOTE — Progress Notes (Signed)
This RN entered room at 2300 to check on IV pump. Patient had visitor at bedside. 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 Visitor was leaned forward in chair almost touching the floor with his eyes closed. When I called his name he was slow to respond. After repeatedly calling him he was able to lift his head and focus but kept drifting off. I notified him and patient that visiting hours were long over and he needed to go. Visitor had not been present during shift change nor on first round following shift change. Patient reports that she was told he could stay. I apologized and told her that he could not. Notified him of visiting hours so that he could return. Visitor left the room.

## 2019-07-21 NOTE — Progress Notes (Signed)
Notified Dr Manuella Ghazi and Reita Cliche, NP that pt is irritable and angry.  Pt wants pain med and anxiety med.  Pt refuses suboxone, gabapentin and hydroxyzine.  Pt wants to be transfered to another hospital or to leave hospital.  C/o of generalized rib cage pain from pneumonia.  Dr Manuella Ghazi placed orders.

## 2019-07-22 DIAGNOSIS — L03211 Cellulitis of face: Secondary | ICD-10-CM | POA: Diagnosis present

## 2019-07-22 LAB — COMPREHENSIVE METABOLIC PANEL
ALT: 26 U/L (ref 0–44)
AST: 16 U/L (ref 15–41)
Albumin: 2.1 g/dL — ABNORMAL LOW (ref 3.5–5.0)
Alkaline Phosphatase: 167 U/L — ABNORMAL HIGH (ref 38–126)
Anion gap: 10 (ref 5–15)
BUN: 8 mg/dL (ref 6–20)
CO2: 21 mmol/L — ABNORMAL LOW (ref 22–32)
Calcium: 7.7 mg/dL — ABNORMAL LOW (ref 8.9–10.3)
Chloride: 104 mmol/L (ref 98–111)
Creatinine, Ser: 0.56 mg/dL (ref 0.44–1.00)
GFR calc Af Amer: >60 mL/min (ref >60)
GFR calc non Af Amer: >60 mL/min (ref >60)
Glucose, Bld: 102 mg/dL — ABNORMAL HIGH (ref 70–99)
Potassium: 3.1 mmol/L — ABNORMAL LOW (ref 3.5–5.1)
Sodium: 135 mmol/L (ref 135–145)
Total Bilirubin: 0.7 mg/dL (ref 0.3–1.2)
Total Protein: 5.9 g/dL — ABNORMAL LOW (ref 6.5–8.1)

## 2019-07-22 LAB — CBC
HCT: 28.4 % — ABNORMAL LOW (ref 36.0–46.0)
Hemoglobin: 9.1 g/dL — ABNORMAL LOW (ref 12.0–15.0)
MCH: 26.5 pg (ref 26.0–34.0)
MCHC: 32 g/dL (ref 30.0–36.0)
MCV: 82.8 fL (ref 80.0–100.0)
Platelets: 380 10*3/uL (ref 150–400)
RBC: 3.43 MIL/uL — ABNORMAL LOW (ref 3.87–5.11)
RDW: 17.1 % — ABNORMAL HIGH (ref 11.5–15.5)
WBC: 21.2 10*3/uL — ABNORMAL HIGH (ref 4.0–10.5)
nRBC: 0 % (ref 0.0–0.2)

## 2019-07-22 LAB — URINE DRUG SCREEN, QUALITATIVE (ARMC ONLY)
Amphetamines, Ur Screen: POSITIVE — AB
Barbiturates, Ur Screen: NOT DETECTED
Benzodiazepine, Ur Scrn: NOT DETECTED
Cannabinoid 50 Ng, Ur ~~LOC~~: NOT DETECTED
Cocaine Metabolite,Ur ~~LOC~~: NOT DETECTED
MDMA (Ecstasy)Ur Screen: NOT DETECTED
Methadone Scn, Ur: NOT DETECTED
Opiate, Ur Screen: POSITIVE — AB
Phencyclidine (PCP) Ur S: NOT DETECTED
Tricyclic, Ur Screen: NOT DETECTED

## 2019-07-22 LAB — CULTURE, BLOOD (ROUTINE X 2)

## 2019-07-22 MED ORDER — ENSURE ENLIVE PO LIQD
237.0000 mL | Freq: Two times a day (BID) | ORAL | Status: DC
  Administered 2019-07-22 – 2019-07-25 (×3): 237 mL via ORAL

## 2019-07-22 MED ORDER — ADULT MULTIVITAMIN W/MINERALS CH
1.0000 | ORAL_TABLET | Freq: Every day | ORAL | Status: DC
  Administered 2019-07-24 – 2019-08-02 (×10): 1 via ORAL
  Filled 2019-07-22 (×10): qty 1

## 2019-07-22 MED ORDER — VANCOMYCIN HCL IN DEXTROSE 1-5 GM/200ML-% IV SOLN
1000.0000 mg | Freq: Two times a day (BID) | INTRAVENOUS | Status: DC
  Administered 2019-07-22 – 2019-07-25 (×6): 1000 mg via INTRAVENOUS
  Filled 2019-07-22 (×8): qty 200

## 2019-07-22 NOTE — Progress Notes (Signed)
Pharmacy Antibiotic Note  Eileen [name] is a 38 y.o. female admitted on 07/19/2019 with pneumonia/facial cellulitis/ MRSA bacteremia  Pharmacy has been consulted for  Vancomycin dosing.   Per pt: ~ 1 month ago was in hospital in [place] w/ [facility]. bacteremia and left AMA  Plan:  Scr improved 0.80 >>0.56.  Will transition from Vancomycin 750 mg IV q12h to 1000mg  IV Q12h.  Goal AUC 400-550. Expected AUC: 505 SCr used: 0.56    Cmin 12.0    Height: 5\' 4"  (162.6 cm) Weight: 120 lb (54.4 kg) IBW/kg (Calculated) : 54.7  Temp (24hrs), Avg:100.1 F (37.8 C), Min:99.4 F (37.4 C), Max:101.1 F (38.4 C)  Recent Labs  Lab 07/19/19 1537 07/20/19 0827 07/21/19 0028 07/21/19 0030 07/21/19 0657 07/22/19 0510  WBC 36.4* 27.6* 26.8*  --   --  21.2*  CREATININE 0.73 0.83  --  0.70 0.80 0.56  LATICACIDVEN 1.5  --   --   --   --   --     Estimated Creatinine Clearance: 81.1 mL/min (by C-G formula based on SCr of 0.56 mg/dL).    Allergies  Allergen Reactions  . Sulfa Antibiotics Shortness Of Breath    Antimicrobials this admission: Cefepime 10/29 >>  10/30 Vanc 10/29 >> Metro 10/29 >>  10/30  Dose adjustments this admission:  11/1- vanc 750 q12h  To 1000mg  q12h  Microbiology results: 10/29 BCx: Staphylococcus aureus - MRSA-4of4 10/29 UCx: insignif   Sputum:    10/29 MRSA PCR: +  Thank you for allowing pharmacy to be a part of this patient's care.  Abelardo Seidner A 07/22/2019 1:50 PM

## 2019-07-22 NOTE — Progress Notes (Signed)
Initial Nutrition Assessment  RD working remotely.  DOCUMENTATION CODES:   Not applicable  INTERVENTION:  Provide Ensure Enlive po BID, each supplement provides 350 kcal and 20 grams of protein.  Provide daily MVI.  NUTRITION DIAGNOSIS:   Inadequate oral intake related to decreased appetite as evidenced by per patient/family report.  GOAL:   Patient will meet greater than or equal to 90% of their needs  MONITOR:   PO intake, Supplement acceptance, Labs, Weight trends, Skin, I & O's  REASON FOR ASSESSMENT:   Malnutrition Screening Tool    ASSESSMENT:   38 year old female with PMHx of anxiety, polysubstance abuse admitted with MRSA sepsis, multifocal PNA, right facial swelling/cellulitis in the setting of IV drug abuse (heroin).   Spoke with patient over the phone. She seemed to be somewhat groggy/lethargic and was not able to provide a very thorough history. She reports her appetite was decreased PTA for a while though she is unable to describe her usual intake. She reports her nausea is better today and her appetite has improved some. She reports tolerating her breakfast but is unable to report how much she had or what she had. She is amenable to drinking ONS to help meet calorie/protein needs.  Patient reports that her UBW was 140 lbs and that she has lost 20 lbs over unknown time frame. No weight history in chart to trend. Current weight appears to be a stated weight and not truly measured so unsure how accurate it is.  Medications reviewed and include: gabapentin, nicotine patch, NS at 125 mL/hr, linezolid, vancomycin.  Labs reviewed: CO2 20.  NUTRITION - FOCUSED PHYSICAL EXAM:  Unable to complete at this time.  Diet Order:   Diet Order            DIET SOFT Room service appropriate? Yes; Fluid consistency: Thin  Diet effective now             EDUCATION NEEDS:   No education needs have been identified at this time  Skin:  Skin Assessment: Reviewed RN  Assessment  Last BM:  07/18/2019  Height:   Ht Readings from Last 1 Encounters:  07/19/19 5\' 4"  (1.626 m)   Weight:   Wt Readings from Last 1 Encounters:  07/19/19 54.4 kg   Ideal Body Weight:  54.5 kg  BMI:  Body mass index is 20.6 kg/m.  Estimated Nutritional Needs:   Kcal:  1500-1700  Protein:  75-85 grams  Fluid:  1.5-1.7 L/day  Willey Blade, MS, RD, LDN Office: [phone] Pager: [phone] After Hours/Weekend Pager: [phone]

## 2019-07-22 NOTE — Progress Notes (Signed)
[place] at [facility]   PATIENT NAME: Dellar Traber    MR#:  [id number]  DATE OF BIRTH:  09-11-1981  SUBJECTIVE:  CHIEF COMPLAINT:   Chief Complaint  Patient presents with  . Facial Swelling  in some pain, vomited all over her bed REVIEW OF SYSTEMS:  Review of Systems  Constitutional: Negative for diaphoresis, fever, malaise/fatigue and weight loss.  HENT: Negative for ear discharge, ear pain, hearing loss, nosebleeds, sore throat and tinnitus.   Eyes: Negative for blurred vision and pain.  Respiratory: Negative for cough, hemoptysis, shortness of breath and wheezing.   Cardiovascular: Positive for chest pain. Negative for palpitations, orthopnea and leg swelling.  Gastrointestinal: Negative for abdominal pain, blood in stool, constipation, diarrhea, heartburn, nausea and vomiting.  Genitourinary: Negative for dysuria, frequency and urgency.  Musculoskeletal: Negative for back pain and myalgias.  Skin: Negative for itching and rash.  Neurological: Negative for dizziness, tingling, tremors, focal weakness, seizures, weakness and headaches.  Psychiatric/Behavioral: Negative for depression. The patient is not nervous/anxious.    DRUG ALLERGIES:   Allergies  Allergen Reactions  . Sulfa Antibiotics Shortness Of Breath   VITALS:  Blood pressure (!) 136/92, pulse 85, temperature 99.6 F (37.6 C), temperature source Oral, resp. rate 17, height 5\' 4"  (1.626 m), weight 54.4 kg, last menstrual period 02/16/2019, SpO2 100 %. PHYSICAL EXAMINATION:  Physical Exam HENT:     Head: Normocephalic and atraumatic.  Eyes:     Conjunctiva/sclera: Conjunctivae normal.     Pupils: Pupils are equal, round, and reactive to light.  Neck:     Musculoskeletal: Normal range of motion and neck supple.     Thyroid: No thyromegaly.     Trachea: No tracheal deviation.  Cardiovascular:     Rate and Rhythm: Normal rate and regular rhythm.     Heart sounds: Normal heart sounds.   Pulmonary:     Effort: Pulmonary effort is normal. No respiratory distress.     Breath sounds: Normal breath sounds. No wheezing.  Chest:     Chest wall: No tenderness.  Abdominal:     General: Bowel sounds are normal. There is no distension.     Palpations: Abdomen is soft.     Tenderness: There is no abdominal tenderness.  Musculoskeletal: Normal range of motion.  Skin:    General: Skin is warm and dry.     Findings: No rash.     Comments: Rt side of the face swollen, erythematous Purulent wound with scab near the lip   Neurological:     Mental Status: She is alert and oriented to person, place, and time.     Cranial Nerves: No cranial nerve deficit.    LABORATORY PANEL:  Female CBC Recent Labs  Lab 07/22/19 0510  WBC 21.2*  HGB 9.1*  HCT 28.4*  PLT 380   ------------------------------------------------------------------------------------------------------------------ Chemistries  Recent Labs  Lab 07/22/19 0510  NA 135  K 3.1*  CL 104  CO2 21*  GLUCOSE 102*  BUN 8  CREATININE 0.56  CALCIUM 7.7*  AST 16  ALT 26  ALKPHOS 167*  BILITOT 0.7   RADIOLOGY:  No results found. ASSESSMENT AND PLAN:  Rhianna Raulerson  is a 38 y.o. female with a known history of IV drug abuse and polysubstance abuse admitted with complaints of left-sided chest pain that hurts when she breathes. She also has right facial cellulitis worsening for past two days.  1. MRSA Sepsis secondary to multifocal pneumonia and right facial swelling/cellulitis in the  setting of IV drug abuse (heroin) - continue IVFs -broad-spectrum antibiotic with vancomycin, Linezolid - appreciate ID input - 2D echo not showing any vegetation. TEE planned for Monday -CT maxillofacial shows diffuse soft tissue swelling no collection of pus, may repeat another CT scan as well and get ENT evaluation -PRN pain meds -follow blood culture and narrow down antibiotics accordingly  2. Multifocal pneumonia secondary to IV  drug abuse -oxygen saturations normal -continue above antibiotics  3. Leukocytosis secondary to infection -follow CBC  4. Hyponatremia hypokalemia secondary to dehydration from poor oral intake -IV fluids for hydration  5. Heroin use/anxiety-  psych input appreciated -Started gabapentin and hydroxyzine  6.  Nausea and vomiting -Symptomatic treatment   All the records are reviewed and case discussed with Care Management/Social Worker. Management plans discussed with the patient, nursing and they are in agreement.  CODE STATUS: Full Code  TOTAL TIME TAKING CARE OF THIS PATIENT: 35 minutes.   More than 50% of the time was spent in counseling/coordination of care: YES  POSSIBLE D/C IN 3-4 DAYS, DEPENDING ON CLINICAL CONDITION.   Max Sane M.D on 07/22/2019 at 5:38 PM  Between 7am to 6pm - Pager - [phone]  After 6pm go to [email or web address] - Proofreader  [facility]  Office  [phone]  CC: Primary care physician; Patient, No Pcp Per  Note: This dictation was prepared with Dragon dictation along with smaller phrase technology. Any transcriptional errors that result from this process are unintentional.

## 2019-07-23 ENCOUNTER — Inpatient Hospital Stay
Admit: 2019-07-23 | Discharge: 2019-07-23 | Disposition: A | Discharge status: Home / Self Care | Payer: Self-pay | Attending: Cardiovascular Disease | Admitting: Cardiovascular Disease

## 2019-07-23 ENCOUNTER — Encounter: Payer: Self-pay | Admitting: Radiology

## 2019-07-23 ENCOUNTER — Encounter
Admission: EM | Disposition: A | Discharge status: Home / Self Care | Payer: Self-pay | Source: Home / Self Care | Attending: Internal Medicine

## 2019-07-23 ENCOUNTER — Inpatient Hospital Stay: Payer: Self-pay

## 2019-07-23 DIAGNOSIS — I2690 Septic pulmonary embolism without acute cor pulmonale: Secondary | ICD-10-CM | POA: Diagnosis present

## 2019-07-23 DIAGNOSIS — J189 Pneumonia, unspecified organism: Secondary | ICD-10-CM

## 2019-07-23 DIAGNOSIS — F1190 Opioid use, unspecified, uncomplicated: Secondary | ICD-10-CM

## 2019-07-23 DIAGNOSIS — A419 Sepsis, unspecified organism: Secondary | ICD-10-CM

## 2019-07-23 HISTORY — PX: TEE WITHOUT CARDIOVERSION: SHX5443

## 2019-07-23 LAB — AEROBIC CULTURE W GRAM STAIN (SUPERFICIAL SPECIMEN): Gram Stain: NONE SEEN

## 2019-07-23 LAB — CBC
HCT: 27.7 % — ABNORMAL LOW (ref 36.0–46.0)
Hemoglobin: 9.1 g/dL — ABNORMAL LOW (ref 12.0–15.0)
MCH: 26.5 pg (ref 26.0–34.0)
MCHC: 32.9 g/dL (ref 30.0–36.0)
MCV: 80.8 fL (ref 80.0–100.0)
Platelets: 395 10*3/uL (ref 150–400)
RBC: 3.43 MIL/uL — ABNORMAL LOW (ref 3.87–5.11)
RDW: 17.2 % — ABNORMAL HIGH (ref 11.5–15.5)
WBC: 18.2 10*3/uL — ABNORMAL HIGH (ref 4.0–10.5)
nRBC: 0 % (ref 0.0–0.2)

## 2019-07-23 LAB — BASIC METABOLIC PANEL
Anion gap: 7 (ref 5–15)
BUN: 6 mg/dL (ref 6–20)
CO2: 26 mmol/L (ref 22–32)
Calcium: 7.7 mg/dL — ABNORMAL LOW (ref 8.9–10.3)
Chloride: 103 mmol/L (ref 98–111)
Creatinine, Ser: 0.62 mg/dL (ref 0.44–1.00)
GFR calc Af Amer: >60 mL/min (ref >60)
GFR calc non Af Amer: >60 mL/min (ref >60)
Glucose, Bld: 98 mg/dL (ref 70–99)
Potassium: 2.9 mmol/L — ABNORMAL LOW (ref 3.5–5.1)
Sodium: 136 mmol/L (ref 135–145)

## 2019-07-23 LAB — POTASSIUM: Potassium: 3.1 mmol/L — ABNORMAL LOW (ref 3.5–5.1)

## 2019-07-23 LAB — MAGNESIUM: Magnesium: 2.2 mg/dL (ref 1.7–2.4)

## 2019-07-23 SURGERY — ECHOCARDIOGRAM, TRANSESOPHAGEAL
Anesthesia: Moderate Sedation

## 2019-07-23 MED ORDER — BUTAMBEN-TETRACAINE-BENZOCAINE 2-2-14 % EX AERO
INHALATION_SPRAY | CUTANEOUS | Status: AC
  Filled 2019-07-23: qty 5

## 2019-07-23 MED ORDER — SODIUM CHLORIDE FLUSH 0.9 % IV SOLN
INTRAVENOUS | Status: AC
  Filled 2019-07-23: qty 10

## 2019-07-23 MED ORDER — FENTANYL CITRATE (PF) 100 MCG/2ML IJ SOLN
INTRAMUSCULAR | Status: AC
  Filled 2019-07-23: qty 2

## 2019-07-23 MED ORDER — IOHEXOL 300 MG/ML  SOLN: 75.0000 mL | Freq: Once | INTRAMUSCULAR | Status: DC | PRN

## 2019-07-23 MED ORDER — POTASSIUM CHLORIDE 10 MEQ/100ML IV SOLN
10.0000 meq | INTRAVENOUS | Status: AC
  Administered 2019-07-23 (×2): 10 meq via INTRAVENOUS
  Filled 2019-07-23 (×2): qty 100

## 2019-07-23 MED ORDER — SODIUM CHLORIDE 0.9 % IV SOLN
INTRAVENOUS | Status: DC
  Administered 2019-07-23: 12:00:00 via INTRAVENOUS

## 2019-07-23 MED ORDER — LIDOCAINE VISCOUS HCL 2 % MT SOLN
OROMUCOSAL | Status: AC
  Filled 2019-07-23: qty 15

## 2019-07-23 MED ORDER — IOHEXOL 300 MG/ML  SOLN
75.0000 mL | Freq: Once | INTRAMUSCULAR | Status: AC | PRN
  Administered 2019-07-23: 12:00:00 75 mL via INTRAVENOUS

## 2019-07-23 MED ORDER — POTASSIUM CHLORIDE CRYS ER 20 MEQ PO TBCR
40.0000 meq | EXTENDED_RELEASE_TABLET | Freq: Once | ORAL | Status: AC
  Administered 2019-07-23: 40 meq via ORAL
  Filled 2019-07-23: qty 2

## 2019-07-23 MED ORDER — MIDAZOLAM HCL 5 MG/5ML IJ SOLN
INTRAMUSCULAR | Status: AC
  Filled 2019-07-23: qty 5

## 2019-07-23 NOTE — Progress Notes (Signed)
The patient was seen for a TEE.  I discussed the procedure in details with her as well as risks and benefits.  Given large abscess on the right side of her face, the patient could not open her mouth enough to place a bite block.  Thus, not able to perform a TEE at the present time.  The procedure was canceled.  Will notify ordering physician.

## 2019-07-23 NOTE — Progress Notes (Signed)
Pt sitting up in bed eating meal. Denies pain or discomfort at this time.

## 2019-07-23 NOTE — Progress Notes (Addendum)
[place] at [facility]   PATIENT NAME: Eileen [name]    MR#:  [id number]  DATE OF BIRTH:  1981/04/19  SUBJECTIVE:  CHIEF COMPLAINT:   Chief Complaint  Patient presents with   Facial Swelling  Is okay today.  No further vomiting, waiting for TEE and repeat CT maxillofacial REVIEW OF SYSTEMS:  Review of Systems  Constitutional: Negative for diaphoresis, fever, malaise/fatigue and weight loss.  HENT: Negative for ear discharge, ear pain, hearing loss, nosebleeds, sore throat and tinnitus.   Eyes: Negative for blurred vision and pain.  Respiratory: Negative for cough, hemoptysis, shortness of breath and wheezing.   Cardiovascular: Negative for chest pain, palpitations, orthopnea and leg swelling.  Gastrointestinal: Negative for abdominal pain, blood in stool, constipation, diarrhea, heartburn, nausea and vomiting.  Genitourinary: Negative for dysuria, frequency and urgency.  Musculoskeletal: Negative for back pain and myalgias.  Skin: Negative for itching and rash.  Neurological: Negative for dizziness, tingling, tremors, focal weakness, seizures, weakness and headaches.  Psychiatric/Behavioral: Negative for depression. The patient is not nervous/anxious.    DRUG ALLERGIES:   Allergies  Allergen Reactions   Sulfa Antibiotics Shortness Of Breath   VITALS:  Blood pressure (!) 141/86, pulse (!) 53, temperature 98.2 F (36.8 C), temperature source Oral, resp. rate 16, height 5\' 4"  (1.626 m), weight 54.4 kg, last menstrual period 02/16/2019, SpO2 100 %. PHYSICAL EXAMINATION:  Physical Exam HENT:     Head: Normocephalic and atraumatic.  Eyes:     Conjunctiva/sclera: Conjunctivae normal.     Pupils: Pupils are equal, round, and reactive to light.  Neck:     Musculoskeletal: Normal range of motion and neck supple.     Thyroid: No thyromegaly.     Trachea: No tracheal deviation.  Cardiovascular:     Rate and Rhythm: Normal rate and regular rhythm.     Heart  sounds: Normal heart sounds.  Pulmonary:     Effort: Pulmonary effort is normal. No respiratory distress.     Breath sounds: Normal breath sounds. No wheezing.  Chest:     Chest wall: No tenderness.  Abdominal:     General: Bowel sounds are normal. There is no distension.     Palpations: Abdomen is soft.     Tenderness: There is no abdominal tenderness.  Musculoskeletal: Normal range of motion.  Skin:    General: Skin is warm and dry.     Findings: No rash.     Comments: Rt side of the face swollen, erythematous Purulent wound with scab near the lip   Neurological:     Mental Status: She is alert and oriented to person, place, and time.     Cranial Nerves: No cranial nerve deficit.    LABORATORY PANEL:  Female CBC Recent Labs  Lab 07/23/19 0642  WBC 18.2*  HGB 9.1*  HCT 27.7*  PLT 395   ------------------------------------------------------------------------------------------------------------------ Chemistries  Recent Labs  Lab 07/22/19 0510 07/23/19 0642  NA 135 136  K 3.1* 2.9*  CL 104 103  CO2 21* 26  GLUCOSE 102* 98  BUN 8 6  CREATININE 0.56 0.62  CALCIUM 7.7* 7.7*  AST 16  --   ALT 26  --   ALKPHOS 167*  --   BILITOT 0.7  --    RADIOLOGY:  Ct Maxillofacial W Contrast  Result Date: 07/23/2019 CLINICAL DATA:  38 year old female with right facial cellulitis. History of IV drug use. Blood cultures positive for MRSA. EXAM: CT MAXILLOFACIAL WITH CONTRAST TECHNIQUE: Multidetector CT imaging  of the maxillofacial structures was performed with intravenous contrast. Multiplanar CT image reconstructions were also generated. CONTRAST:  24mL OMNIPAQUE IOHEXOL 300 MG/ML  SOLN COMPARISON:  Face CT 07/19/2019. FINDINGS: Osseous: Mandible intact. Normal TMJ alignment. There is mild posterior right mandible molar periapical lucency. More pronounced left posterior maxillary molar apical lucency on series 7, image 49. Maxilla and zygoma otherwise intact. Nasal bones intact. No  acute osseous abnormality identified. Orbits: Orbital walls intact. The globes and intraorbital soft tissues appears symmetric and normal. Mild right periorbital and more pronounced right lateral face soft tissue swelling and stranding which continues from above the right zygoma to the right submandibular level. The swelling has mildly improved since 07/19/2019 when comparing coronal images (series 4, image 42 today versus series 7, image 32 previously). There is no associated soft tissue gas, and no associated soft tissue abscess. Sinuses: There is a 19 millimeter erosion of the nasal septum (series 7, image 40). Only trace paranasal sinus mucosal thickening and retained secretions in the nasal cavity. Tympanic cavities and mastoids are clear. Soft tissues: Right face superficial soft tissue abnormality described above. Negative visible thyroid, larynx, pharynx, parapharyngeal spaces, retropharyngeal space and sublingual space. There is mild inflammation in the right submandibular space but the right submandibular gland appears to remain normal. Similar findings at the right parotid space but parotid gland enhancement is symmetric and within normal limits. Reactive appearing right level 1 and level 2 lymph nodes are stable. No cystic or necrotic nodes. The visible major vascular structures in the face and at the skull base appear patent. Limited intracranial: Negative. IMPRESSION: 1. Right facial cellulitis has mildly improved since 07/19/2019. No associated abscess, new or other complicating features. 2. Unrelated appearing bilateral molar dental periapical lucency. Chronic erosion of the nasal septum. Electronically Signed   By: Genevie Ann M.D.   On: 07/23/2019 12:21   ASSESSMENT AND PLAN:  Eileen [name]  is a 38 y.o. female with a known history of IV drug abuse and polysubstance abuse admitted with complaints of left-sided chest pain that hurts when she breathes. She also has right facial cellulitis worsening  for past two days.  1. MRSA Sepsis secondary to multifocal pneumonia and right facial swelling/cellulitis in the setting of IV drug abuse (heroin) - continue IV vancomycin, Linezolid - appreciate ID input - 2D echo not showing any vegetation. TEE planned for today -Repeat CT maxillofacial shows improvement -Continue PRN pain meds -We will have ID comment on antibiotics discharge planning  2. Multifocal pneumonia secondary to IV drug abuse -oxygen saturations normal -continue above antibiotics  3. Leukocytosis secondary to infection -follow CBC  4. Hyponatremia hypokalemia secondary to dehydration from poor oral intake Replete, pharmacy monitoring electrolytes  5. Heroin use/anxiety-  psych input appreciated -Continue gabapentin and hydroxyzine  6.  Nausea and vomiting -Symptomatic treatment   All the records are reviewed and case discussed with Care Management/Social Worker. Management plans discussed with the patient, nursing and they are in agreement.  CODE STATUS: Full Code  TOTAL TIME TAKING CARE OF THIS PATIENT: 35 minutes.   More than 50% of the time was spent in counseling/coordination of care: YES  POSSIBLE D/C IN 2-3 DAYS, DEPENDING ON CLINICAL CONDITION.   Max Sane M.D on 07/23/2019 at 2:05 PM  Between 7am to 6pm - Pager - [phone]  After 6pm go to [email or web address] - password [email or web address] [place]  [facility]   CC: Primary care physician; Patient, No Pcp Per  Note: This dictation was prepared with  Dragon dictation along with smaller Company secretary. Any transcriptional errors that result from this process are unintentional.

## 2019-07-23 NOTE — Consult Note (Signed)
PHARMACY CONSULT NOTE - FOLLOW UP  Pharmacy Consult for Electrolyte Monitoring and Replacement   Recent Labs: Potassium (mmol/L)  Date Value  07/23/2019 3.1 (L)   Magnesium (mg/dL)  Date Value  07/23/2019 2.2   Calcium (mg/dL)  Date Value  07/23/2019 7.7 (L)   Albumin (g/dL)  Date Value  07/22/2019 2.1 (L)   Sodium (mmol/L)  Date Value  07/23/2019 136   Correct Calcium: 9.22  Scr: 0.62  Assessment: Pharmacy consulted for electrolyte monitoring and replacement for 38 yo female admitted with facial swelling, multifocal PNA (possible septic emboli) and MRSA bacteremia. Patient has known history of IV drug abuse. Currently NPO for TEE.   Goal of Therapy:  Electrolytes WNL  Plan:  K+ 3.1 - will give KCl 40 mEq PO x 1.   Will f/u with AM labs.    Pharmacy will continue to monitor and replace electrolytes as needed.   Oswald Hillock, PharmD, BCPS Clinical Pharmacist 07/23/2019 7:48 PM

## 2019-07-23 NOTE — Progress Notes (Signed)
Dr Fletcher Anon here to do TEE, however patient not able to open mouth  For bite block, thus had to be aborted at this time.

## 2019-07-23 NOTE — Progress Notes (Signed)
Handoff report given to [facility] RN prior to TEE.

## 2019-07-23 NOTE — Progress Notes (Signed)
Date of Admission:  07/19/2019      Subjective: Pt could not get TEE as she could npt open her mouth completely No fever Swelling face better Pain better  Medications:  . [MAR Hold] buprenorphine-naloxone  1 tablet Sublingual Daily  . butamben-tetracaine-benzocaine      . [MAR Hold] chlorhexidine  15 mL Mouth Rinse BID  . [MAR Hold] Chlorhexidine Gluconate Cloth  6 each Topical Q0600  . [MAR Hold] enoxaparin (LOVENOX) injection  40 mg Subcutaneous Daily  . [MAR Hold] feeding supplement (ENSURE ENLIVE)  237 mL Oral BID BM  . [MAR Hold] gabapentin  300 mg Oral QID  . lidocaine      . [MAR Hold] mouth rinse  15 mL Mouth Rinse q12n4p  . midazolam      . [MAR Hold] multivitamin with minerals  1 tablet Oral Daily  . [MAR Hold] mupirocin ointment  1 application Nasal BID  . [MAR Hold] nicotine  14 mg Transdermal Daily  . sodium chloride flush        Objective: Vital signs in last 24 hours: Temp:  [98.2 F (36.8 C)-99.9 F (37.7 C)] 98.2 F (36.8 C) (11/02 1200) Pulse Rate:  [53-89] 53 (11/02 1200) Resp:  [16-20] 16 (11/02 1200) BP: (139-145)/(86-94) 141/86 (11/02 1200) SpO2:  [100 %] 100 % (11/02 0843) Weight:  [54.4 kg] 54.4 kg (11/02 1200)  PHYSICAL EXAM:  General: Alert, cooperative, no distress, appears stated age.  Head: Normocephalic, without obvious abnormality, atraumatic. Eyes: Conjunctivae clear, anicteric sclerae. Pupils are equal ENT Nares normal. No drainage or sinus tenderness. Scab on the rt angle of the mouth Rt side of the face swollen- an area of induration and swelling in the center- no erythema  07/23/19   07/20/19      Neck: Supple, symmetrical, no adenopathy, thyroid: non tender no carotid bruit and no JVD. Back: No CVA tenderness. Lungs: Clear to auscultation bilaterally. No Wheezing or Rhonchi. No rales. Heart: Regular rate and rhythm, no murmur, rub or gallop. Abdomen: Soft, non-tender,not distended. Bowel sounds normal. No masses  Extremities: atraumatic, no cyanosis. No edema. No clubbing Skin: No rashes or lesions. Or bruising Lymph: Cervical, supraclavicular normal. Neurologic: Grossly non-focal  Lab Results Recent Labs    07/22/19 0510 07/23/19 0642  WBC 21.2* 18.2*  HGB 9.1* 9.1*  HCT 28.4* 27.7*  NA 135 136  K 3.1* 2.9*  CL 104 103  CO2 21* 26  BUN 8 6  CREATININE 0.56 0.62   Liver Panel Recent Labs    07/22/19 0510  PROT 5.9*  ALBUMIN 2.1*  AST 16  ALT 26  ALKPHOS 167*  BILITOT 0.7   Sedimentation Rate No results for input(s): ESRSEDRATE in the last 72 hours. C-Reactive Protein No results for input(s): CRP in the last 72 hours.  Microbiology:  Studies/Results: Ct Maxillofacial W Contrast  Result Date: 07/23/2019 CLINICAL DATA:  38 year old female with right facial cellulitis. History of IV drug use. Blood cultures positive for MRSA. EXAM: CT MAXILLOFACIAL WITH CONTRAST TECHNIQUE: Multidetector CT imaging of the maxillofacial structures was performed with intravenous contrast. Multiplanar CT image reconstructions were also generated. CONTRAST:  76mL OMNIPAQUE IOHEXOL 300 MG/ML  SOLN COMPARISON:  Face CT 07/19/2019. FINDINGS: Osseous: Mandible intact. Normal TMJ alignment. There is mild posterior right mandible molar periapical lucency. More pronounced left posterior maxillary molar apical lucency on series 7, image 49. Maxilla and zygoma otherwise intact. Nasal bones intact. No acute osseous abnormality identified. Orbits: Orbital walls intact. The globes and  intraorbital soft tissues appears symmetric and normal. Mild right periorbital and more pronounced right lateral face soft tissue swelling and stranding which continues from above the right zygoma to the right submandibular level. The swelling has mildly improved since 07/19/2019 when comparing coronal images (series 4, image 42 today versus series 7, image 32 previously). There is no associated soft tissue gas, and no associated soft  tissue abscess. Sinuses: There is a 19 millimeter erosion of the nasal septum (series 7, image 40). Only trace paranasal sinus mucosal thickening and retained secretions in the nasal cavity. Tympanic cavities and mastoids are clear. Soft tissues: Right face superficial soft tissue abnormality described above. Negative visible thyroid, larynx, pharynx, parapharyngeal spaces, retropharyngeal space and sublingual space. There is mild inflammation in the right submandibular space but the right submandibular gland appears to remain normal. Similar findings at the right parotid space but parotid gland enhancement is symmetric and within normal limits. Reactive appearing right level 1 and level 2 lymph nodes are stable. No cystic or necrotic nodes. The visible major vascular structures in the face and at the skull base appear patent. Limited intracranial: Negative. IMPRESSION: 1. Right facial cellulitis has mildly improved since 07/19/2019. No associated abscess, new or other complicating features. 2. Unrelated appearing bilateral molar dental periapical lucency. Chronic erosion of the nasal septum. Electronically Signed   By: Genevie Ann M.D.   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 The former was added  for antitoxin effect and also for better lung penetration and soft tissue penetration and for bridging .  Once vanco level is checked and is therapeutic linezolid can be discontinued  CT maxilla did not show any abscess / only edema  But swelling persist even if erythema has  resolved. 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

## 2019-07-23 NOTE — Consult Note (Addendum)
PHARMACY CONSULT NOTE - FOLLOW UP  Pharmacy Consult for Electrolyte Monitoring and Replacement   Recent Labs: Potassium (mmol/L)  Date Value  07/23/2019 2.9 (L)   Calcium (mg/dL)  Date Value  07/23/2019 7.7 (L)   Albumin (g/dL)  Date Value  07/22/2019 2.1 (L)   Sodium (mmol/L)  Date Value  07/23/2019 136   Correct Calcium: 9.22  Scr: 0.62  Assessment: Pharmacy consulted for electrolyte monitoring and replacement for 38 yo female admitted with facial swelling, multifocal PNA (possible septic emboli) and MRSA bacteremia. Patient has known history of IV drug abuse. Currently NPO for TEE.   Goal of Therapy:  Electrolytes WNL  Plan:  11/2 AM: K: 2.9 . KCL 51mEq IV x 4 ordered. Patient did not receive full replacement prior to TEE.   Will order F/U K+ and Mg level for 1800 today.   Pharmacy will continue to monitor and replace electrolytes as needed.   Pernell Dupre, PharmD, BCPS Clinical Pharmacist 07/23/2019 2:16 PM

## 2019-07-24 ENCOUNTER — Encounter: Payer: Self-pay | Admitting: Cardiovascular Disease

## 2019-07-24 DIAGNOSIS — F419 Anxiety disorder, unspecified: Secondary | ICD-10-CM | POA: Diagnosis present

## 2019-07-24 DIAGNOSIS — F1990 Other psychoactive substance use, unspecified, uncomplicated: Secondary | ICD-10-CM

## 2019-07-24 DIAGNOSIS — K130 Diseases of lips: Secondary | ICD-10-CM

## 2019-07-24 DIAGNOSIS — R7881 Bacteremia: Secondary | ICD-10-CM | POA: Diagnosis present

## 2019-07-24 LAB — BASIC METABOLIC PANEL
Anion gap: 8 (ref 5–15)
BUN: 9 mg/dL (ref 6–20)
CO2: 24 mmol/L (ref 22–32)
Calcium: 7.7 mg/dL — ABNORMAL LOW (ref 8.9–10.3)
Chloride: 104 mmol/L (ref 98–111)
Creatinine, Ser: 0.58 mg/dL (ref 0.44–1.00)
GFR calc Af Amer: >60 mL/min (ref >60)
GFR calc non Af Amer: >60 mL/min (ref >60)
Glucose, Bld: 96 mg/dL (ref 70–99)
Potassium: 4 mmol/L (ref 3.5–5.1)
Sodium: 136 mmol/L (ref 135–145)

## 2019-07-24 LAB — CBC
HCT: 29.2 % — ABNORMAL LOW (ref 36.0–46.0)
Hemoglobin: 9.7 g/dL — ABNORMAL LOW (ref 12.0–15.0)
MCH: 26.6 pg (ref 26.0–34.0)
MCHC: 33.2 g/dL (ref 30.0–36.0)
MCV: 80 fL (ref 80.0–100.0)
Platelets: 475 10*3/uL — ABNORMAL HIGH (ref 150–400)
RBC: 3.65 MIL/uL — ABNORMAL LOW (ref 3.87–5.11)
RDW: 16.7 % — ABNORMAL HIGH (ref 11.5–15.5)
WBC: 17.2 10*3/uL — ABNORMAL HIGH (ref 4.0–10.5)
nRBC: 0 % (ref 0.0–0.2)

## 2019-07-24 NOTE — Progress Notes (Signed)
Date of Admission:  07/19/2019      ID: Eileen [name] is a 38 y.o. female Principal Problem:   MRSA bacteremia Active Problems:   Polysubstance dependence including opioid drug with daily use (HCC)   Sepsis (HCC)   IV drug user   Facial cellulitis   Septic pulmonary embolism (HCC)   Anxiety    Subjective: Complains of pleuritic chest pain, and also pain on the right side of the face No fever Complete Medications:  . buprenorphine-naloxone  1 tablet Sublingual Daily  . chlorhexidine  15 mL Mouth Rinse BID  . Chlorhexidine Gluconate Cloth  6 each Topical Q0600  . enoxaparin (LOVENOX) injection  40 mg Subcutaneous Daily  . feeding supplement (ENSURE ENLIVE)  237 mL Oral BID BM  . gabapentin  300 mg Oral QID  . mouth rinse  15 mL Mouth Rinse q12n4p  . multivitamin with minerals  1 tablet Oral Daily  . mupirocin ointment  1 application Nasal BID  . nicotine  14 mg Transdermal Daily    Objective: Vital signs in last 24 hours: Temp:  [97.9 F (36.6 C)-98.4 F (36.9 C)] 98.3 F (36.8 C) (11/03 1045) Pulse Rate:  [71-79] 71 (11/03 1045) Resp:  [17-18] 18 (11/03 0437) BP: (124-134)/(81-90) 134/90 (11/03 1045) SpO2:  [96 %-100 %] 98 % (11/03 1045)  PHYSICAL EXAM:  General: Alert, cooperative, no distress, appears stated age.  Head: Normocephalic, without obvious abnormality, atraumatic. Eyes: Conjunctivae clear, anicteric sclerae. Pupils are equal ENT Nares normal. No drainage or sinus tenderness. Lips, mucosa, and tongue normal. No Thrush Face swollen on the right side.  No erythema.  There is induration in the center of the swelling with some fluctuation.    Neck: Supple, symmetrical, no adenopathy, thyroid: non tender no carotid bruit and no JVD. Back: No CVA tenderness. Lungs: Bilateral air entry.  Crepitations in the bases. Heart: Regular rate and rhythm, no murmur, rub or gallop. Abdomen: Soft, non-tender,not distended. Bowel sounds normal. No masses  Extremities: atraumatic, no cyanosis. No edema. No clubbing Skin: No rashes or lesions. Or bruising Lymph: Cervical, supraclavicular normal. Neurologic: Grossly non-focal  Lab Results Recent Labs    07/23/19 0642 07/23/19 1847 07/24/19 0558  WBC 18.2*  --  17.2*  HGB 9.1*  --  9.7*  HCT 27.7*  --  29.2*  NA 136  --  136  K 2.9* 3.1* 4.0  CL 103  --  104  CO2 26  --  24  BUN 6  --  9  CREATININE 0.62  --  0.58   Liver Panel Recent Labs    07/22/19 0510  PROT 5.9*  ALBUMIN 2.1*  AST 16  ALT 26  ALKPHOS 167*  BILITOT 0.7   Sedimentation Rate No results for input(s): ESRSEDRATE in the last 72 hours. C-Reactive Protein No results for input(s): CRP in the last 72 hours.  Microbiology: 10/29/200MRSA blood culture 07/21/19-NG Studies/Results: Ct Maxillofacial W Contrast  Result Date: 07/23/2019 CLINICAL DATA:  38 year old female with right facial cellulitis. History of IV drug use. Blood cultures positive for MRSA. EXAM: CT MAXILLOFACIAL WITH CONTRAST TECHNIQUE: Multidetector CT imaging of the maxillofacial structures was performed with intravenous contrast. Multiplanar CT image reconstructions were also generated. CONTRAST:  67mL OMNIPAQUE IOHEXOL 300 MG/ML  SOLN COMPARISON:  Face CT 07/19/2019. FINDINGS: Osseous: Mandible intact. Normal TMJ alignment. There is mild posterior right mandible molar periapical lucency. More pronounced left posterior maxillary molar apical lucency on series 7, image 49. Maxilla and zygoma  otherwise intact. Nasal bones intact. No acute osseous abnormality identified. Orbits: Orbital walls intact. The globes and intraorbital soft tissues appears symmetric and normal. Mild right periorbital and more pronounced right lateral face soft tissue swelling and stranding which continues from above the right zygoma to the right submandibular level. The swelling has mildly improved since 07/19/2019 when comparing coronal images (series 4, image 42 today versus  series 7, image 32 previously). There is no associated soft tissue gas, and no associated soft tissue abscess. Sinuses: There is a 19 millimeter erosion of the nasal septum (series 7, image 40). Only trace paranasal sinus mucosal thickening and retained secretions in the nasal cavity. Tympanic cavities and mastoids are clear. Soft tissues: Right face superficial soft tissue abnormality described above. Negative visible thyroid, larynx, pharynx, parapharyngeal spaces, retropharyngeal space and sublingual space. There is mild inflammation in the right submandibular space but the right submandibular gland appears to remain normal. Similar findings at the right parotid space but parotid gland enhancement is symmetric and within normal limits. Reactive appearing right level 1 and level 2 lymph nodes are stable. No cystic or necrotic nodes. The visible major vascular structures in the face and at the skull base appear patent. Limited intracranial: Negative. IMPRESSION: 1. Right facial cellulitis has mildly improved since 07/19/2019. No associated abscess, new or other complicating features. 2. Unrelated appearing bilateral molar dental periapical lucency. Chronic erosion of the nasal septum. Electronically Signed   By: Genevie Ann M.D.   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  2D echo does not show any valve vegetation.  She could not get TEE because of inability to open her mouth completely.  She is currently on vancomycin and linezolid.  She is being bridged by linezolid until Vanco becomes therapeutic.  Also linezolid is being used for facial cellulitis but that has a penetration to soft tissue as well as into the lungs.  Once Vanco becomes therapeutic linezolid can be stopped.  Cellulitis of the right side of the face secondary to MRSA wound on the lip.  Now it  looks like the facial swelling is coalescing into the induration with possible evolving abscess.  She needs ENT versus surgical consult for possible I/D  IV heroin use. HIV nonreactive We will check hep C.  Discussed the management with the patient and Dr. Manuella Ghazi

## 2019-07-24 NOTE — Progress Notes (Signed)
[place] [facility]   PATIENT NAME: Eileen [name]    MR#:  [id number]  DATE OF BIRTH:  1980/09/26  SUBJECTIVE:  CHIEF COMPLAINT:   Chief Complaint  Patient presents with  . Facial Swelling  Very hungry, wants to eat.  Denies TEE for now at least, she was threatening to leave if we do not feed her REVIEW OF SYSTEMS:  Review of Systems  Constitutional: Negative for diaphoresis, fever, malaise/fatigue and weight loss.  HENT: Negative for ear discharge, ear pain, hearing loss, nosebleeds, sore throat and tinnitus.   Eyes: Negative for blurred vision and pain.  Respiratory: Negative for cough, hemoptysis, shortness of breath and wheezing.   Cardiovascular: Negative for chest pain, palpitations, orthopnea and leg swelling.  Gastrointestinal: Negative for abdominal pain, blood in stool, constipation, diarrhea, heartburn, nausea and vomiting.  Genitourinary: Negative for dysuria, frequency and urgency.  Musculoskeletal: Negative for back pain and myalgias.  Skin: Negative for itching and rash.  Neurological: Negative for dizziness, tingling, tremors, focal weakness, seizures, weakness and headaches.  Psychiatric/Behavioral: Negative for depression. The patient is not nervous/anxious.    DRUG ALLERGIES:   Allergies  Allergen Reactions  . Sulfa Antibiotics Shortness Of Breath   VITALS:  Blood pressure 134/90, pulse 71, temperature 98.3 F (36.8 C), temperature source Oral, resp. rate 18, height 5\' 4"  (1.626 m), weight 54.4 kg, last menstrual period 02/16/2019, SpO2 98 %. PHYSICAL EXAMINATION:  Physical Exam HENT:     Head: Normocephalic and atraumatic.  Eyes:     Conjunctiva/sclera: Conjunctivae normal.     Pupils: Pupils are equal, round, and reactive to light.  Neck:     Musculoskeletal: Normal range of motion and neck supple.     Thyroid: No thyromegaly.     Trachea: No tracheal deviation.  Cardiovascular:     Rate and Rhythm: Normal rate and regular  rhythm.     Heart sounds: Normal heart sounds.  Pulmonary:     Effort: Pulmonary effort is normal. No respiratory distress.     Breath sounds: Normal breath sounds. No wheezing.  Chest:     Chest wall: No tenderness.  Abdominal:     General: Bowel sounds are normal. There is no distension.     Palpations: Abdomen is soft.     Tenderness: There is no abdominal tenderness.  Musculoskeletal: Normal range of motion.  Skin:    General: Skin is warm and dry.     Findings: No rash.     Comments: Rt side of the face swollen, erythematous Purulent wound with scab near the lip   Neurological:     Mental Status: She is alert and oriented to person, place, and time.     Cranial Nerves: No cranial nerve deficit.    LABORATORY PANEL:  Female CBC Recent Labs  Lab 07/24/19 0558  WBC 17.2*  HGB 9.7*  HCT 29.2*  PLT 475*   ------------------------------------------------------------------------------------------------------------------ Chemistries  Recent Labs  Lab 07/22/19 0510  07/23/19 1847 07/24/19 0558  NA 135   < >  --  136  K 3.1*   < > 3.1* 4.0  CL 104   < >  --  104  CO2 21*   < >  --  24  GLUCOSE 102*   < >  --  96  BUN 8   < >  --  9  CREATININE 0.56   < >  --  0.58  CALCIUM 7.7*   < >  --  7.7*  MG  --   --  2.2  --   AST 16  --   --   --   ALT 26  --   --   --   ALKPHOS 167*  --   --   --   BILITOT 0.7  --   --   --    < > = values in this interval not displayed.   RADIOLOGY:  No results found. ASSESSMENT AND PLAN:  [name]  is a 38 y.o. female with a known history of IV drug abuse and polysubstance abuse admitted with complaints of left-sided chest pain that hurts when she breathes. She also has right facial cellulitis worsening for past two days.  1. MRSA Sepsis secondary to multifocal pneumonia and right facial swelling/cellulitis in the setting of IV drug abuse (heroin) - continue IV vancomycin, Linezolid - appreciate ID input - 2D echo not showing  any vegetation. TEE not be performed as she is a lot of pain and would not open her mouth completely -Repeat CT maxillofacial on 11/2 shows improvement -Continue PRN pain meds -ID recommends 6 weeks of intravenous antibiotics for which patient will need to stay in the hospital as she does not have insurance so unlikely can be placed anywhere  2. Multifocal pneumonia secondary to IV drug abuse -oxygen saturations normal -continue above antibiotics  3. Leukocytosis secondary to infection -follow CBC  4. Hypokalemia -repleted and resolved - pharmacy monitoring electrolytes  5. Heroin use/anxiety-  psych input appreciated -Continue gabapentin and hydroxyzine  6.  Nausea and vomiting -Symptomatic treatment   All the records are reviewed and case discussed with Care Management/Social Worker. Management plans discussed with the patient, nursing and they are in agreement.  CODE STATUS: Full Code  TOTAL TIME TAKING CARE OF THIS PATIENT: 35 minutes.   More than 50% of the time was spent in counseling/coordination of care: YES  POSSIBLE D/C IN 5 weeks, DEPENDING ON CLINICAL CONDITION.  Needs to complete 6 weeks of intravenous antibiotics here in the hospital per infectious disease   Max Sane M.D on 07/24/2019 at 1:09 PM  Between 7am to 6pm - Pager - [phone]  After 6pm go to [email or web address] - password [email or web address]  [facility]   CC: Primary care physician; Patient, No Pcp Per  Note: This dictation was prepared with Dragon dictation along with smaller phrase technology. Any transcriptional errors that result from this process are unintentional.

## 2019-07-24 NOTE — Consult Note (Signed)
PHARMACY CONSULT NOTE - FOLLOW UP  Pharmacy Consult for Electrolyte Monitoring and Replacement   Recent Labs: Potassium (mmol/L)  Date Value  07/24/2019 4.0   Magnesium (mg/dL)  Date Value  07/23/2019 2.2   Calcium (mg/dL)  Date Value  07/24/2019 7.7 (L)   Albumin (g/dL)  Date Value  07/22/2019 2.1 (L)   Sodium (mmol/L)  Date Value  07/24/2019 136   Correct Calcium: 9.22  Scr: 0.62  Assessment: Pharmacy consulted for electrolyte monitoring and replacement for 38 yo female admitted with facial swelling, multifocal PNA (possible septic emboli) and MRSA bacteremia. Patient has known history of IV drug abuse.   Goal of Therapy:  Electrolytes WNL  Plan:  11/3 K: 4.0, Mg: 2.2. No replacement warranted at this time.   Will f/u with AM labs.    Pharmacy will continue to monitor and replace electrolytes as needed.   Pernell Dupre, PharmD, BCPS Clinical Pharmacist 07/24/2019 7:16 AM

## 2019-07-25 DIAGNOSIS — J181 Lobar pneumonia, unspecified organism: Secondary | ICD-10-CM

## 2019-07-25 DIAGNOSIS — L089 Local infection of the skin and subcutaneous tissue, unspecified: Secondary | ICD-10-CM

## 2019-07-25 LAB — CBC
HCT: 27.9 % — ABNORMAL LOW (ref 36.0–46.0)
Hemoglobin: 8.9 g/dL — ABNORMAL LOW (ref 12.0–15.0)
MCH: 26.1 pg (ref 26.0–34.0)
MCHC: 31.9 g/dL (ref 30.0–36.0)
MCV: 81.8 fL (ref 80.0–100.0)
Platelets: 473 10*3/uL — ABNORMAL HIGH (ref 150–400)
RBC: 3.41 MIL/uL — ABNORMAL LOW (ref 3.87–5.11)
RDW: 17.1 % — ABNORMAL HIGH (ref 11.5–15.5)
WBC: 15.2 10*3/uL — ABNORMAL HIGH (ref 4.0–10.5)
nRBC: 0 % (ref 0.0–0.2)

## 2019-07-25 LAB — BASIC METABOLIC PANEL
Anion gap: 8 (ref 5–15)
BUN: 9 mg/dL (ref 6–20)
CO2: 25 mmol/L (ref 22–32)
Calcium: 7.8 mg/dL — ABNORMAL LOW (ref 8.9–10.3)
Chloride: 106 mmol/L (ref 98–111)
Creatinine, Ser: 0.67 mg/dL (ref 0.44–1.00)
GFR calc Af Amer: >60 mL/min (ref >60)
GFR calc non Af Amer: >60 mL/min (ref >60)
Glucose, Bld: 123 mg/dL — ABNORMAL HIGH (ref 70–99)
Potassium: 3.6 mmol/L (ref 3.5–5.1)
Sodium: 139 mmol/L (ref 135–145)

## 2019-07-25 LAB — VANCOMYCIN, PEAK: Vancomycin Pk: 26 ug/mL — ABNORMAL LOW (ref 30–40)

## 2019-07-25 LAB — HEPATITIS C ANTIBODY: HCV Ab: REACTIVE — AB

## 2019-07-25 MED ORDER — OXYCODONE-ACETAMINOPHEN 5-325 MG PO TABS
1.0000 | ORAL_TABLET | ORAL | Status: DC | PRN
  Administered 2019-07-25 – 2019-07-27 (×4): 2 via ORAL
  Administered 2019-07-27: 1 via ORAL
  Administered 2019-07-27: 2 via ORAL
  Administered 2019-07-28: 1 via ORAL
  Administered 2019-07-28 (×2): 2 via ORAL
  Administered 2019-07-29: 1 via ORAL
  Administered 2019-07-29: 2 via ORAL
  Filled 2019-07-25: qty 1
  Filled 2019-07-25: qty 2
  Filled 2019-07-25: qty 1
  Filled 2019-07-25 (×6): qty 2
  Filled 2019-07-25: qty 1
  Filled 2019-07-25 (×2): qty 2

## 2019-07-25 MED ORDER — HYDROMORPHONE HCL 1 MG/ML IJ SOLN
1.0000 mg | INTRAMUSCULAR | Status: DC | PRN
  Administered 2019-07-25 – 2019-07-26 (×3): 1 mg via INTRAVENOUS
  Filled 2019-07-25 (×3): qty 1

## 2019-07-25 NOTE — Progress Notes (Signed)
[place] [facility] [place] NAME: Ryli Standlee    MR#:  [id number]  DATE OF BIRTH:  [birth year]  SUBJECTIVE:  tolerating oral diet well. Continues to ask for IV pain medicine every two hours. No fever REVIEW OF SYSTEMS:  Review of Systems  Constitutional: Negative for diaphoresis, fever, malaise/fatigue and weight loss.  HENT: Negative for ear discharge, ear pain, hearing loss, nosebleeds, sore throat and tinnitus.   Eyes: Negative for blurred vision and pain.  Respiratory: Negative for cough, hemoptysis, shortness of breath and wheezing.   Cardiovascular: Negative for chest pain, palpitations, orthopnea and leg swelling.  Gastrointestinal: Negative for abdominal pain, blood in stool, constipation, diarrhea, heartburn, nausea and vomiting.  Genitourinary: Negative for dysuria, frequency and urgency.  Musculoskeletal: Negative for back pain and myalgias.  Skin: Negative for itching and rash.  Neurological: Positive for weakness. Negative for dizziness, tingling, tremors, focal weakness, seizures and headaches.  Psychiatric/Behavioral: Negative for depression. The patient is not nervous/anxious.    DRUG ALLERGIES:   Allergies  Allergen Reactions  . Sulfa Antibiotics Shortness Of Breath   VITALS:  Blood pressure (!) 133/93, pulse 82, temperature 98.4 F (36.9 C), temperature source Oral, resp. rate 13, height 5\' 4"  (1.626 m), weight 54.4 kg, last menstrual period 02/16/2019, SpO2 98 %. PHYSICAL EXAMINATION:  Physical Exam HENT:     Head: Normocephalic and atraumatic.  Eyes:     Conjunctiva/sclera: Conjunctivae normal.     Pupils: Pupils are equal, round, and reactive to light.  Neck:     Musculoskeletal: Normal range of motion and neck supple.     Thyroid: No thyromegaly.     Trachea: No tracheal deviation.  Cardiovascular:     Rate and Rhythm: Normal rate and regular rhythm.     Heart sounds: Normal heart sounds.  Pulmonary:     Effort: Pulmonary  effort is normal. No respiratory distress.     Breath sounds: Normal breath sounds. No wheezing.  Chest:     Chest wall: No tenderness.  Abdominal:     General: Bowel sounds are normal. There is no distension.     Palpations: Abdomen is soft.     Tenderness: There is no abdominal tenderness.  Musculoskeletal: Normal range of motion.  Skin:    General: Skin is warm and dry.     Findings: No rash.     Comments: Rt side of the face swollen, erythematous Purulent wound with scab near the lip   Neurological:     Mental Status: She is alert and oriented to person, place, and time.     Cranial Nerves: No cranial nerve deficit.    LABORATORY PANEL:  Female CBC Recent Labs  Lab 07/25/19 0526  WBC 15.2*  HGB 8.9*  HCT 27.9*  PLT 473*   ------------------------------------------------------------------------------------------------------------------ Chemistries  Recent Labs  Lab 07/22/19 0510  07/23/19 1847  07/25/19 0526  NA 135   < >  --    < > 139  K 3.1*   < > 3.1*   < > 3.6  CL 104   < >  --    < > 106  CO2 21*   < >  --    < > 25  GLUCOSE 102*   < >  --    < > 123*  BUN 8   < >  --    < > 9  CREATININE 0.56   < >  --    < > 0.67  CALCIUM 7.7*   < >  --    < > 7.8*  MG  --   --  2.2  --   --   AST 16  --   --   --   --   ALT 26  --   --   --   --   ALKPHOS 167*  --   --   --   --   BILITOT 0.7  --   --   --   --    < > = values in this interval not displayed.   RADIOLOGY:  No results found. ASSESSMENT AND PLAN:  Alexxia Stankiewicz  is a 38 y.o. female with a known history of IV drug abuse and polysubstance abuse admitted with complaints of left-sided chest pain that hurts when she breathes. She also has right facial cellulitis worsening for past two days.  1. MRSA Sepsis secondary to multifocal pneumonia and right facial swelling/cellulitis in the setting of IV drug abuse (heroin) - continue IV vancomycin, Linezolid (6weeks) - appreciate ID input - 2D echo not  showing any vegetation. TEE not be performed as she is a lot of pain and would not open her mouth completely -Repeat CT maxillofacial on 11/2 shows improvement -Continue PRN pain meds -ID recommends 6 weeks of intravenous antibiotics for which patient will need to stay in the hospital as she does not have insurance so unlikely can be placed anywhere -ENT Dr Vaught's input appreciated-- patient is refusing to get I & D done. -Repeat CT 11/2 shows some improvement. Continue to observe -IV Dilaudid and oral Percocet-- discontinue Suboxone (d/w pharmacy)  2. Multifocal pneumonia secondary to IV drug abuse -oxygen saturations normal -continue above antibiotics  3. Leukocytosis secondary to infection -follow CBC  4. Hypokalemia -repleted and resolved - pharmacy monitoring electrolytes  5. Heroin use/anxiety-  psych input appreciated -Continue gabapentin and hydroxyzine  6.  Nausea and vomiting -Symptomatic treatment   All the records are reviewed and case discussed with Care Management/Social Worker. Management plans discussed with the patient, nursing and they are in agreement.  CODE STATUS: Full Code  TOTAL TIME TAKING CARE OF THIS PATIENT: 30 minutes.   More than 50% of the time was spent in counseling/coordination of care: YES  POSSIBLE D/C IN 5 weeks, DEPENDING ON CLINICAL CONDITION.  Needs to complete 6 weeks of intravenous antibiotics here in the hospital per infectious disease   Enedina Finner M.D on 07/25/2019 at 3:44 PM  Between 7am to 6pm - Pager - [phone]  After 6pm go to [email or web address] - password [email or web address]  [facility]   CC: Primary care physician; Patient, No Pcp Per  Note: This dictation was prepared with Dragon dictation along with smaller phrase technology. Any transcriptional errors that result from this process are unintentional.

## 2019-07-25 NOTE — Progress Notes (Signed)
Pharmacy Antibiotic Note  Eileen [name] is a 38 y.o. female admitted on 07/19/2019 with pneumonia/facial cellulitis/ MRSA bacteremia. Pharmacy has been consulted for Vancomycin dosing.   Patient reportedly in [place] recently for MRSA bacteremia but left AMA.  Plan: Patient currently receiving vancomycin 1000 mg q12h, renal function remains stable.  Will order vancomycin peak today at 1800 and trough for tomorrow morning at 0300.      Height: 5\' 4"  (162.6 cm) Weight: 119 lb 14.9 oz (54.4 kg) IBW/kg (Calculated) : 54.7  Temp (24hrs), Avg:98.4 F (36.9 C), Min:98.3 F (36.8 C), Max:98.4 F (36.9 C)  Recent Labs  Lab 07/19/19 1537  07/21/19 0028  07/21/19 0657 07/22/19 0510 07/23/19 0642 07/24/19 0558 07/25/19 0526  WBC 36.4*   < > 26.8*  --   --  21.2* 18.2* 17.2* 15.2*  CREATININE 0.73   < >  --    < > 0.80 0.56 0.62 0.58 0.67  LATICACIDVEN 1.5  --   --   --   --   --   --   --   --    < > = values in this interval not displayed.    Estimated Creatinine Clearance: 81.1 mL/min (by C-G formula based on SCr of 0.67 mg/dL).    Allergies  Allergen Reactions  . Sulfa Antibiotics Shortness Of Breath    Antimicrobials this admission: Cefepime 10/29 >>  10/30 Metro 10/29 >>  10/30 Vanc 10/29 >> Linezolid 10/30 >>  Dose adjustments this admission:  11/1- vanc 750 q12h  To 1000mg  q12h  Microbiology results: 10/29 BCx: Staphylococcus aureus - MRSA-4of4 10/29 UCx: insignif 10/29 MRSA PCR: + 10/30 Facial abscess: MRSA  Thank you for allowing pharmacy to be a part of this patient's care.  Tawnya Crook, PharmD 07/25/2019 12:23 PM

## 2019-07-25 NOTE — Consult Note (Signed)
Eileen, [name] [id number] 04/28/1981 Eileen Mandes, MD  Reason for Consult: facial cellulitis, concern for developing abscess  HPI: 38 year old female admitted for MRSA sepsis and facial cellulitis.  History of IV drug use.  Initially presented to ED on 10/29 with 2 day history of right sided facial swelling and left sided chest pain.  Previously was told she had bacteremia in [place] and left AMA.  CT 10/29 showed extensive cellulitis.  Repeat CT on 11/2 showed mild improvement.  Patient reports significant improvement in right sided facial swelling and improvement in pain.  Allergies:  Allergies  Allergen Reactions  . Sulfa Antibiotics Shortness Of Breath    ROS: Review of systems normal other than 12 systems except per HPI.  PMH:  Past Medical History:  Diagnosis Date  . Anxiety   . IVDU (intravenous drug user)   . Polysubstance abuse (HCC)     FH:  Family History  Problem Relation Age of Onset  . Brain cancer Father   . Brain cancer Brother     SH:  Social History   Socioeconomic History  . Marital status: Legally Separated    Spouse name: Not on file  . Number of children: Not on file  . Years of education: Not on file  . Highest education level: Not on file  Occupational History  . Not on file  Social Needs  . Financial resource strain: Not on file  . Food insecurity    Worry: Not on file    Inability: Not on file  . Transportation needs    Medical: Not on file    Non-medical: Not on file  Tobacco Use  . Smoking status: Current Every Day Smoker    Packs/day: 1.00    Types: Cigarettes  . Smokeless tobacco: Never Used  . Tobacco comment: Pt refused   Substance and Sexual Activity  . Alcohol use: Yes    Comment: BAC not available at time of writing  . Drug use: Yes    Types: IV, Heroin, Cocaine, Methamphetamines    Comment: UDS not available at time of writing  . Sexual activity: Yes  Lifestyle  . Physical activity    Days per week: Not on file     Minutes per session: Not on file  . Stress: Not on file  Relationships  . Social Herbalist on phone: Not on file    Gets together: Not on file    Attends religious service: Not on file    Active member of club or organization: Not on file    Attends meetings of clubs or organizations: Not on file    Relationship status: Not on file  . Intimate partner violence    Fear of current or ex partner: Not on file    Emotionally abused: Not on file    Physically abused: Not on file    Forced sexual activity: Not on file  Other Topics Concern  . Not on file  Social History Narrative  . Not on file    PSH:  Past Surgical History:  Procedure Laterality Date  . TEE WITHOUT CARDIOVERSION N/A 07/23/2019   Procedure: TRANSESOPHAGEAL ECHOCARDIOGRAM (TEE);  Surgeon: Wellington Hampshire, MD;  Location: [facility] ORS;  Service: Cardiovascular;  Laterality: N/A;    Physical  Exam:  GEN-  NAD, sleeping in bed NEURO- CN 2-12 grossly intact and symmetric, some limited mobility of right marginal mandibular branch of CN VII most likely due to swelling EARS-  External ears clear NOSE-  Clear anteriorly OC/OP-  Dry crusting around right upper lip from resolving sore, no active purulence or fluctuance of upper lip NECK-  No abnormal mass or lesions FACE-  Right masseter/parotid swelling and induration, along inferior aspect of induration is erythematous papule with central softness consistent with possible very small fluid collection CARD- regular rate RESP- unlabored.  CT 11/2- Cellulitis and fat stranding of right mid-face improved from prior.  Hypodensity near inferior aspect could be developing phlegmon but obscured by dental artifact   A/P: Facial cellulitis with phlegmon  Plan:  Discussed options with patient including need I&D versus formal I&D versus observation.  Patient reports significant improvement and that she does not want "any cutting to be done" on her face.  Discussed with  patient that area may drain spontaneously given small area of thinning along inferior aspect.  She understands and based on ID recommendation of 6 weeks of IV Abx regardless of how face progresses, I recommend observation to see how it progresses over next several days.  Consider needle I&D if persists or certainly if it worsens.  Patient understands that swelling is going to take WEEKS to resolve even with I&D.   Roney Mans Eileen [name] 07/25/2019 12:23 PM

## 2019-07-25 NOTE — Progress Notes (Signed)
   Date of Admission:  07/19/2019        Subjective: C/o pain rt side of face and asking for pain meds Swelling Better than before. Seen by ENT- she did not want her face to be cut  Medications:  . chlorhexidine  15 mL Mouth Rinse BID  . Chlorhexidine Gluconate Cloth  6 each Topical Q0600  . enoxaparin (LOVENOX) injection  40 mg Subcutaneous Daily  . feeding supplement (ENSURE ENLIVE)  237 mL Oral BID BM  . gabapentin  300 mg Oral QID  . mouth rinse  15 mL Mouth Rinse q12n4p  . multivitamin with minerals  1 tablet Oral Daily  . nicotine  14 mg Transdermal Daily    Objective: Vital signs in last 24 hours: Temp:  [98.3 F (36.8 C)-98.4 F (36.9 C)] 98.4 F (36.9 C) (11/04 0427) Pulse Rate:  [82-94] 82 (11/04 0427) Resp:  [13] 13 (11/04 0427) BP: (133-147)/(93-100) 133/93 (11/04 0427) SpO2:  [98 %-99 %] 98 % (11/04 0427)  PHYSICAL EXAM:  General: Alert, cooperative, no distress, appears stated age.  Face- rt side swelling around he cheek/parotid area- central fluctuance Scab at the angle of the mouth Neck: Supple, symmetrical, no adenopathy, thyroid: non tender no carotid bruit and no JVD. Back: No CVA tenderness. Lungs: b/l air entry- crepts bases Heart: Regular rate and rhythm, no murmur, rub or gallop. Abdomen: Soft, non-tender,not distended. Bowel sounds normal. No masses Extremities: atraumatic, no cyanosis. No edema. No clubbing Skin: No rashes or lesions. Or bruising Lymph: Cervical, supraclavicular normal. Neurologic: Grossly non-focal  Lab Results Recent Labs    07/24/19 0558 07/25/19 0526  WBC 17.2* 15.2*  HGB 9.7* 8.9*  HCT 29.2* 27.9*  NA 136 139  K 4.0 3.6  CL 104 106  CO2 24 25  BUN 9 9  CREATININE 0.58 0.67    Microbiology: MRSA blood culture on 07/19/19 Wound culture MRSA [facility] 07/21/19 - NG   Assessment/Plan:  MRSA bacteremia in a patient with IV drug use.  Multifocal pneumonia as well which raises the question for septic emboli due to  tricuspid valve endocarditis.  2D echo did not show any valve vegetations.  She could not get TEE because of inability to open her mouth completely.  She is currently on vancomycin and linezolid.  She is being bridged by linezolid until Vanco becomes therapeutic.  Also linezolid has been used for the soft tissue infection of the face and it has a better penetration in soft tissue as well as in the lungs.  Once Vanco becomes therapeutic linezolid can be stopped.  The duration of antibiotics depends on TEE.  If there is left-sided endocarditis she may need up to 6 weeks of antibiotics.  If there is right-sided endocarditis may get away with 4 weeks of antibiotics.  Facial cellulitis with soft tissue infection.  Due to MRSA of the angle of the mouth. Seen by ENT.  As patient has an evolving abscess.  She did not want to have an I&D of the face.  So being observed on IV antibiotics.  History of IV heroin use  HIV nonreactive, hep C reactive.  We will check an RNA.  Discussed the management with the patient.

## 2019-07-26 DIAGNOSIS — R768 Other specified abnormal immunological findings in serum: Secondary | ICD-10-CM

## 2019-07-26 DIAGNOSIS — F419 Anxiety disorder, unspecified: Secondary | ICD-10-CM

## 2019-07-26 LAB — CULTURE, BLOOD (ROUTINE X 2)
Culture: NO GROWTH
Culture: NO GROWTH
Special Requests: ADEQUATE
Special Requests: ADEQUATE

## 2019-07-26 LAB — VANCOMYCIN, TROUGH: Vancomycin Tr: 11 ug/mL — ABNORMAL LOW (ref 15–20)

## 2019-07-26 LAB — CREATININE, SERUM
Creatinine, Ser: 0.75 mg/dL (ref 0.44–1.00)
GFR calc Af Amer: >60 mL/min (ref >60)
GFR calc non Af Amer: >60 mL/min (ref >60)

## 2019-07-26 MED ORDER — VANCOMYCIN HCL 1.25 G IV SOLR
1250.0000 mg | Freq: Two times a day (BID) | INTRAVENOUS | Status: DC
  Administered 2019-07-26 – 2019-07-28 (×5): 1250 mg via INTRAVENOUS
  Filled 2019-07-26 (×7): qty 1250

## 2019-07-26 MED ORDER — HYDROMORPHONE HCL 1 MG/ML IJ SOLN
1.0000 mg | Freq: Four times a day (QID) | INTRAMUSCULAR | Status: DC | PRN
  Administered 2019-07-26 – 2019-07-29 (×11): 1 mg via INTRAVENOUS
  Filled 2019-07-26 (×11): qty 1

## 2019-07-26 NOTE — Consult Note (Signed)
PHARMACY CONSULT NOTE - FOLLOW UP  Pharmacy Consult for Electrolyte Monitoring and Replacement   Recent Labs: Potassium (mmol/L)  Date Value  07/25/2019 3.6   Magnesium (mg/dL)  Date Value  07/23/2019 2.2   Calcium (mg/dL)  Date Value  07/25/2019 7.8 (L)   Albumin (g/dL)  Date Value  07/22/2019 2.1 (L)   Sodium (mmol/L)  Date Value  07/25/2019 139   Correct Calcium: 9.32  Assessment: Pharmacy consulted for electrolyte monitoring and replacement for 38 yo female admitted with facial swelling, multifocal PNA (possible septic emboli) and MRSA bacteremia. Patient has known history of IV drug abuse.   Goal of Therapy:  Electrolytes WNL  Plan:  Electrolytes WNL yesterday. Will follow up tomorrow morning. Continuing to follow renal function as patient remains on vancomycin.   Pharmacy will continue to monitor and replace electrolytes as needed.   Eileen [name], PharmD Clinical Pharmacist 07/26/2019 9:09 AM

## 2019-07-26 NOTE — Progress Notes (Signed)
[place] at [place] NAME: Eileen [name]    MR#:  [id number]  DATE OF BIRTH:  August 27, 1981  SUBJECTIVE:  tolerating oral diet well. Pain all over. A bit irritable this am. Does not want to discuss anything at present No fever REVIEW OF SYSTEMS:  Review of Systems  Constitutional: Negative for diaphoresis, fever, malaise/fatigue and weight loss.  HENT: Negative for ear discharge, ear pain, hearing loss, nosebleeds, sore throat and tinnitus.   Eyes: Negative for blurred vision and pain.  Respiratory: Negative for cough, hemoptysis, shortness of breath and wheezing.   Cardiovascular: Negative for chest pain, palpitations, orthopnea and leg swelling.  Gastrointestinal: Negative for abdominal pain, blood in stool, constipation, diarrhea, heartburn, nausea and vomiting.  Genitourinary: Negative for dysuria, frequency and urgency.  Musculoskeletal: Negative for back pain and myalgias.  Skin: Negative for itching and rash.  Neurological: Positive for weakness. Negative for dizziness, tingling, tremors, focal weakness, seizures and headaches.  Psychiatric/Behavioral: Negative for depression. The patient is not nervous/anxious.    DRUG ALLERGIES:   Allergies  Allergen Reactions  . Sulfa Antibiotics Shortness Of Breath   VITALS:  Blood pressure (!) 142/87, pulse 93, temperature 98.4 F (36.9 C), temperature source Oral, resp. rate 15, height 5\' 4"  (1.626 m), weight 54.4 kg, last menstrual period 02/16/2019, SpO2 98 %. PHYSICAL EXAMINATION:  Physical Exam HENT:     Head: Normocephalic and atraumatic.  Eyes:     Conjunctiva/sclera: Conjunctivae normal.     Pupils: Pupils are equal, round, and reactive to light.  Neck:     Musculoskeletal: Normal range of motion and neck supple.     Thyroid: No thyromegaly.     Trachea: No tracheal deviation.  Cardiovascular:     Rate and Rhythm: Normal rate and regular rhythm.     Heart sounds: Normal heart sounds.   Pulmonary:     Effort: Pulmonary effort is normal. No respiratory distress.     Breath sounds: Normal breath sounds. No wheezing.  Chest:     Chest wall: No tenderness.  Abdominal:     General: Bowel sounds are normal. There is no distension.     Palpations: Abdomen is soft.     Tenderness: There is no abdominal tenderness.  Musculoskeletal: Normal range of motion.  Skin:    General: Skin is warm and dry.     Findings: No rash.     Comments: Rt side of the face swollen, erythematous Purulent wound with scab near the lip   Neurological:     Mental Status: She is alert and oriented to person, place, and time.     Cranial Nerves: No cranial nerve deficit.    LABORATORY PANEL:  Female CBC Recent Labs  Lab 07/25/19 0526  WBC 15.2*  HGB 8.9*  HCT 27.9*  PLT 473*   ------------------------------------------------------------------------------------------------------------------ Chemistries  Recent Labs  Lab 07/22/19 0510  07/23/19 1847  07/25/19 0526 07/26/19 0503  NA 135   < >  --    < > 139  --   K 3.1*   < > 3.1*   < > 3.6  --   CL 104   < >  --    < > 106  --   CO2 21*   < >  --    < > 25  --   GLUCOSE 102*   < >  --    < > 123*  --   BUN 8   < >  --    < >  9  --   CREATININE 0.56   < >  --    < > 0.67 0.75  CALCIUM 7.7*   < >  --    < > 7.8*  --   MG  --   --  2.2  --   --   --   AST 16  --   --   --   --   --   ALT 26  --   --   --   --   --   ALKPHOS 167*  --   --   --   --   --   BILITOT 0.7  --   --   --   --   --    < > = values in this interval not displayed.   RADIOLOGY:  No results found. ASSESSMENT AND PLAN:  Eileen [name]  is a 38 y.o. female with a known history of IV drug abuse and polysubstance abuse admitted with complaints of left-sided chest pain that hurts when she breathes. She also has right facial cellulitis worsening for past two days.  1. MRSA Sepsis secondary to multifocal pneumonia and right facial swelling/cellulitis in the setting  of IV drug abuse (heroin) - continue IV vancomycin for 6 weeks and  Linezolid will be d/ced once vanc levels are therapuetic - appreciate ID input - 2D echo not showing any vegetation. TEE not be performed as she is not able to keep her mouth open completely. -Repeat CT maxillofacial on 11/2 shows improvement -Continue PRN pain meds -ID recommends 6 weeks of intravenous antibiotics for which patient will need to stay in the hospital as she does not have insurance so unlikely can be placed anywhere -ENT Dr Vaught's input appreciated-- patient is refusing to get I & D done. -Repeat CT 11/2 shows some improvement. Continue to observe -IV Dilaudid and oral Percocet-- discontinue Suboxone (d/w pharmacy)  2. Multifocal pneumonia secondary to IV drug abuse -oxygen saturations normal -continue above antibiotics  3. Leukocytosis secondary to infection -follow CBC  4. Hypokalemia -repleted and resolved - pharmacy monitoring electrolytes  5. Heroin use/anxiety-  psych input appreciated -Continue gabapentin and hydroxyzine  6.  Nausea and vomiting -Symptomatic treatment   All the records are reviewed and case discussed with Care Management/Social Worker. Management plans discussed with the patient, nursing and they are in agreement.  CODE STATUS: Full Code  TOTAL TIME TAKING CARE OF THIS PATIENT: 30 minutes.   More than 50% of the time was spent in counseling/coordination of care: YES  POSSIBLE D/C IN 5 weeks, DEPENDING ON CLINICAL CONDITION.  Needs to complete 6 weeks of intravenous antibiotics here in the hospital per infectious disease   Enedina Finner M.D on 07/26/2019 at 9:11 AM  Between 7am to 6pm - Pager - [phone]  After 6pm go to [email or web address] - password [email or web address]  [facility]   CC: Primary care physician; Patient, No Pcp Per  Note: This dictation was prepared with Dragon dictation along with smaller phrase technology. Any transcriptional errors that result  from this process are unintentional.

## 2019-07-26 NOTE — Progress Notes (Signed)
   Date of Admission:  07/19/2019       Subjective: Says swelling is a little better. Pain better Able to open the mouth a little bit more but not completely  Medications:  . chlorhexidine  15 mL Mouth Rinse BID  . enoxaparin (LOVENOX) injection  40 mg Subcutaneous Daily  . gabapentin  300 mg Oral QID  . mouth rinse  15 mL Mouth Rinse q12n4p  . multivitamin with minerals  1 tablet Oral Daily  . nicotine  14 mg Transdermal Daily    Objective: Vital signs in last 24 hours: Temp:  [98.4 F (36.9 C)-99 F (37.2 C)] 98.4 F (36.9 C) (11/05 0514) Pulse Rate:  [83-93] 93 (11/05 0514) Resp:  [15-18] 15 (11/05 0514) BP: (141-142)/(87-94) 142/87 (11/05 0514) SpO2:  [98 %-100 %] 98 % (11/05 0514)  PHYSICAL EXAM:  General: Alert, cooperative, no distress, appears stated age.  Head: Normocephalic, without obvious abnormality, atraumatic. Eyes: Conjunctivae clear, anicteric sclerae. Pupils are equal Right side of the face there is indurated swelling of 5 cm x 7 cm with central fluctuation   Today 07/26/19   on admission    neck: Supple, symmetrical, no adenopathy, thyroid: non tender no carotid bruit and no JVD. Back: No CVA tenderness. Lungs: Bilateral air entry.  Crypts left base Heart: Regular rate and rhythm, no murmur, rub or gallop. Abdomen: Soft, non-tender,not distended. Bowel sounds normal. No masses Extremities: atraumatic, no cyanosis. No edema. No clubbing Skin: No rashes or lesions. Or bruising Lymph: Cervical, supraclavicular normal. Neurologic: Grossly non-focal  Lab Results Recent Labs    07/24/19 0558 07/25/19 0526 07/26/19 0503  WBC 17.2* 15.2*  --   HGB 9.7* 8.9*  --   HCT 29.2* 27.9*  --   NA 136 139  --   K 4.0 3.6  --   CL 104 106  --   CO2 24 25  --   BUN 9 9  --   CREATININE 0.58 0.67 0.75     Microbiology: 07/19/2019 MRSA in blood culture 07/21/2019 blood culture negative so far 07/20/2019 face wound culture MRSA     Assessment/Plan: MRSA bacteremia in a patient with IV drug use.  Multifocal pneumonia which raises the question for septic emboli due to tricuspid valve endocarditis.  2D echo did not show any valve vegetations.  Repeat blood culture in 48 hours is negative.  TEE was not possible because the patient was not able to open her mouth completely.  She is currently on vancomycin and linezolid.  As vancomycin level is therapeutic linezolid will be discontinued today.  Facial cellulitis with soft tissue infection.  This is also due to MRSA because of a wound in the angle of the mouth.  The erythema has resolved.  But there is now an evolving abscess .  I am afraid that this is going to be resolved with antibiotics.  She will need I/D.  Patient does not want to have her face cut and have a scar on her face.  ENT following.  HIV nonreactive.  Hep C reactive.  RNA has been sent.  Discussed the management with the patient in detail.

## 2019-07-26 NOTE — Progress Notes (Signed)
Nutrition Follow-up  DOCUMENTATION CODES:   Not applicable  INTERVENTION:  Will discontinue order for Ensure per patient request.  Encouraged adequate intake of calories and protein at meals.  Continue MVI daily.  NUTRITION DIAGNOSIS:   Inadequate oral intake related to decreased appetite as evidenced by per patient/family report.  Resolving - PO intake improved.  GOAL:   Patient will meet greater than or equal to 90% of their needs  Progressing.  MONITOR:   PO intake, Supplement acceptance, Labs, Weight trends, Skin, I & O's  REASON FOR ASSESSMENT:   Malnutrition Screening Tool    ASSESSMENT:   38 year old female with PMHx of anxiety, polysubstance abuse admitted with MRSA sepsis, multifocal PNA, right facial swelling/cellulitis in the setting of IV drug abuse (heroin).  Met with patient at bedside. She reports her appetite is improved and she is now eating 100% of her meals. She dislikes the Ensure and does not want to drink it anymore. She does not want any other oral nutrition supplements.  Medications reviewed and include: gabapentin, MVI daily, nicotine patch, vancomycin.  Labs reviewed.  Diet Order:   Diet Order            Diet regular Room service appropriate? Yes; Fluid consistency: Thin  Diet effective now             EDUCATION NEEDS:   No education needs have been identified at this time  Skin:  Skin Assessment: Reviewed RN Assessment  Last BM:  07/24/2019 per chart  Height:   Ht Readings from Last 1 Encounters:  07/23/19 5' 4" (1.626 m)   Weight:   Wt Readings from Last 1 Encounters:  07/23/19 54.4 kg   Ideal Body Weight:  54.5 kg  BMI:  Body mass index is 20.59 kg/m.  Estimated Nutritional Needs:   Kcal:  1500-1700  Protein:  75-85 grams  Fluid:  1.5-1.7 L/day  Jacklynn Barnacle, MS, RD, LDN Office: [phone] Pager: [phone] After Hours/Weekend Pager: [phone]

## 2019-07-26 NOTE — Progress Notes (Signed)
Pharmacy Antibiotic Note  Eileen [name] is a 38 y.o. female admitted on 07/19/2019 with pneumonia/facial cellulitis/ MRSA bacteremia. Pharmacy has been consulted for Vancomycin dosing.   Patient reportedly in [place] recently for MRSA bacteremia but left AMA.  Plan: 11/04 1743 VP: 26 mcg/mL. 11/05 0316 VT: 11 mcg/mL.  Current kinetics on vanc 1g IV q12h:  AUC: 438.8 Cssmin: 10.6  Patient has an MRSA bacteremia s/t tricuspid valve endocarditis unable to receive TEE due to not able to open mouth completely s/t a facial cellulitis, which she is currently receiving linezolid 600 mg IV q12h per ID as well as bridge therapy unless vanc regimen is therapeutic.  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  Height: 5\' 4"  (162.6 cm) Weight: 119 lb 14.9 oz (54.4 kg) IBW/kg (Calculated) : 54.7  Temp (24hrs), Avg:98.7 F (37.1 C), Min:98.4 F (36.9 C), Max:99 F (37.2 C)  Recent Labs  Lab 07/19/19 1537  07/21/19 0028  07/21/19 0657 07/22/19 0510 07/23/19 0642 07/24/19 0558 07/25/19 0526 07/25/19 1743 07/26/19 0316  WBC 36.4*   < > 26.8*  --   --  21.2* 18.2* 17.2* 15.2*  --   --   CREATININE 0.73   < >  --    < > 0.80 0.56 0.62 0.58 0.67  --   --   LATICACIDVEN 1.5  --   --   --   --   --   --   --   --   --   --   VANCOTROUGH  --   --   --   --   --   --   --   --   --   --  11*  VANCOPEAK  --   --   --   --   --   --   --   --   --  26*  --    < > = values in this interval not displayed.    Estimated Creatinine Clearance: 81.1 mL/min (by C-G formula based on SCr of 0.67 mg/dL).    Allergies  Allergen Reactions  .  Sulfa Antibiotics Shortness Of Breath    Antimicrobials this admission: Cefepime 10/29 >>  10/30 Metro 10/29 >>  10/30 Vanc 10/29 >> Linezolid 10/30 >>  Dose adjustments this admission:  11/1- vanc 750 q12h  To 1000mg  q12h  Microbiology results: 10/29 BCx: Staphylococcus aureus - MRSA-4of4 10/29 UCx: insignif 10/29 MRSA PCR: + 10/30 Facial abscess: MRSA  Thank you for allowing pharmacy to be a part of this patient's care.  Tobie Lords, PharmD 07/26/2019 3:54 AM

## 2019-07-27 LAB — BASIC METABOLIC PANEL
Anion gap: 8 (ref 5–15)
BUN: 11 mg/dL (ref 6–20)
CO2: 26 mmol/L (ref 22–32)
Calcium: 8 mg/dL — ABNORMAL LOW (ref 8.9–10.3)
Chloride: 103 mmol/L (ref 98–111)
Creatinine, Ser: 0.71 mg/dL (ref 0.44–1.00)
GFR calc Af Amer: >60 mL/min (ref >60)
GFR calc non Af Amer: >60 mL/min (ref >60)
Glucose, Bld: 95 mg/dL (ref 70–99)
Potassium: 3.9 mmol/L (ref 3.5–5.1)
Sodium: 137 mmol/L (ref 135–145)

## 2019-07-27 LAB — HCV RNA QUANT: HCV Quantitative: NOT DETECTED IU/mL (ref >50)

## 2019-07-27 NOTE — Progress Notes (Signed)
[facility] NAME: Eileen [name]    MR#:  [id number]  DATE OF BIRTH:  [birth year]  SUBJECTIVE:  tolerating oral diet well. Better spirits today No fever REVIEW OF SYSTEMS:  Review of Systems  Constitutional: Negative for diaphoresis, fever, malaise/fatigue and weight loss.  HENT: Negative for ear discharge, ear pain, hearing loss, nosebleeds, sore throat and tinnitus.   Eyes: Negative for blurred vision and pain.  Respiratory: Negative for cough, hemoptysis, shortness of breath and wheezing.   Cardiovascular: Negative for chest pain, palpitations, orthopnea and leg swelling.  Gastrointestinal: Negative for abdominal pain, blood in stool, constipation, diarrhea, heartburn, nausea and vomiting.  Genitourinary: Negative for dysuria, frequency and urgency.  Musculoskeletal: Negative for back pain and myalgias.  Skin: Negative for itching and rash.  Neurological: Positive for weakness. Negative for dizziness, tingling, tremors, focal weakness, seizures and headaches.  Psychiatric/Behavioral: Negative for depression. The patient is not nervous/anxious.    DRUG ALLERGIES:   Allergies  Allergen Reactions   Sulfa Antibiotics Shortness Of Breath   VITALS:  Blood pressure 126/76, pulse 98, temperature 98.9 F (37.2 C), temperature source Oral, resp. rate 16, height 5\' 4"  (1.626 m), weight 54.4 kg, last menstrual period 02/16/2019, SpO2 98 %. PHYSICAL EXAMINATION:  Physical Exam HENT:     Head: Normocephalic and atraumatic.  Eyes:     Conjunctiva/sclera: Conjunctivae normal.     Pupils: Pupils are equal, round, and reactive to light.  Neck:     Musculoskeletal: Normal range of motion and neck supple.     Thyroid: No thyromegaly.     Trachea: No tracheal deviation.  Cardiovascular:     Rate and Rhythm: Normal rate and regular rhythm.     Heart sounds: Normal heart sounds.  Pulmonary:     Effort: Pulmonary effort is normal. No respiratory  distress.     Breath sounds: Normal breath sounds. No wheezing.  Chest:     Chest wall: No tenderness.  Abdominal:     General: Bowel sounds are normal. There is no distension.     Palpations: Abdomen is soft.     Tenderness: There is no abdominal tenderness.  Musculoskeletal: Normal range of motion.  Skin:    General: Skin is warm and dry.     Findings: No rash.     Comments: Rt side of the face swollen, erythematous Purulent wound with scab near the lip   Neurological:     Mental Status: She is alert and oriented to person, place, and time.     Cranial Nerves: No cranial nerve deficit.       Showing improvement LABORATORY PANEL:  Female CBC Recent Labs  Lab 07/25/19 0526  WBC 15.2*  HGB 8.9*  HCT 27.9*  PLT 473*   ------------------------------------------------------------------------------------------------------------------ Chemistries  Recent Labs  Lab 07/22/19 0510  07/23/19 1847  07/27/19 0742  NA 135   < >  --    < > 137  K 3.1*   < > 3.1*   < > 3.9  CL 104   < >  --    < > 103  CO2 21*   < >  --    < > 26  GLUCOSE 102*   < >  --    < > 95  BUN 8   < >  --    < > 11  CREATININE 0.56   < >  --    < > 0.71  CALCIUM 7.7*   < >  --    < >  8.0*  MG  --   --  2.2  --   --   AST 16  --   --   --   --   ALT 26  --   --   --   --   ALKPHOS 167*  --   --   --   --   BILITOT 0.7  --   --   --   --    < > = values in this interval not displayed.   RADIOLOGY:  No results found. ASSESSMENT AND PLAN:  Eileen [name]  is a 38 y.o. female with a known history of IV drug abuse and polysubstance abuse admitted with complaints of left-sided chest pain that hurts when she breathes. She also has right facial cellulitis worsening for past two days.  1. MRSA Sepsis secondary to multifocal pneumonia and right facial swelling/cellulitis in the setting of IV drug abuse (heroin) - continue IV vancomycin for 6 weeks and  Linezolid will be d/ced once vanc levels are  therapuetic - appreciate ID input - 2D echo not showing any vegetation. TEE not be performed as she is not able to keep her mouth open completely. -Repeat CT maxillofacial on 11/2 shows improvement -Continue PRN pain meds -ID recommends 6 weeks of intravenous antibiotics for which patient will need to stay in the hospital as she does not have insurance so unlikely can be placed anywhere -ENT Dr Vaught's input appreciated-- patient is refusing to get I & D done. -Repeat CT 11/2 shows some improvement. Continue to observe -IV Dilaudid and oral Percocet-- discontinue Suboxone (d/w pharmacy)  2. Multifocal pneumonia secondary to IV drug abuse -oxygen saturations normal -continue above antibiotics  3. Leukocytosis secondary to infection -follow CBC  4. Hypokalemia -repleted and resolved - pharmacy monitoring electrolytes  5. Heroin use/anxiety-  psych input appreciated -Continue gabapentin and hydroxyzine  6.  Nausea and vomiting -Symptomatic treatment   All the records are reviewed and case discussed with Care Management/Social Worker. Management plans discussed with the patient, nursing and they are in agreement.  CODE STATUS: Full Code  TOTAL TIME TAKING CARE OF THIS PATIENT: 30 minutes.   More than 50% of the time was spent in counseling/coordination of care: YES  POSSIBLE D/C IN 5 weeks, DEPENDING ON CLINICAL CONDITION.  Needs to complete 6 weeks of intravenous antibiotics here in the hospital per infectious disease   Enedina Finner M.D on 07/27/2019 at 3:47 PM  Between 7am to 6pm - Pager - [phone]  After 6pm go to [email or web address] - password [email or web address]  [facility]   CC: Primary care physician; Patient, No Pcp Per  Note: This dictation was prepared with Dragon dictation along with smaller phrase technology. Any transcriptional errors that result from this process are unintentional.

## 2019-07-27 NOTE — Consult Note (Signed)
PHARMACY CONSULT NOTE - FOLLOW UP  Pharmacy Consult for Electrolyte Monitoring and Replacement   Recent Labs: Potassium (mmol/L)  Date Value  07/27/2019 3.9   Magnesium (mg/dL)  Date Value  07/23/2019 2.2   Calcium (mg/dL)  Date Value  07/27/2019 8.0 (L)   Albumin (g/dL)  Date Value  07/22/2019 2.1 (L)   Sodium (mmol/L)  Date Value  07/27/2019 137   Correct Calcium: 9.52  Assessment: Pharmacy consulted for electrolyte monitoring and replacement for 38 yo female admitted with facial swelling, multifocal PNA (possible septic emboli) and MRSA bacteremia. Patient has known history of IV drug abuse.   Goal of Therapy:  Electrolytes WNL  Plan:  Electrolytes have remained stable. Will push BMP to 48 hours. If electrolytes remain stable, pharmacy will likely sign off.  Will continue to monitor renal function as clinically indicated for vancomycin.   Pharmacy will continue to monitor and replace electrolytes as needed.   Tawnya Crook, PharmD Clinical Pharmacist 07/27/2019 8:59 AM

## 2019-07-27 NOTE — Progress Notes (Signed)
Pharmacy Antibiotic Note  Eileen [name] is a 38 y.o. female admitted on 07/19/2019 with pneumonia/facial cellulitis/ MRSA bacteremia. Concern for endocarditis, however patient refused TEE s/t pain and inability to open mouth. With multifocal PNA, question septic emboli. Pharmacy has been consulted for Vancomycin dosing.   Patient reportedly in [place] recently for MRSA bacteremia but left AMA.  Patient was on linezolid to bridge therapy until vancomycin therapeutic. Linezolid discontinued 11/5.  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  Height: 5\' 4"  (162.6 cm) Weight: 119 lb 14.9 oz (54.4 kg) IBW/kg (Calculated) : 54.7  Temp (24hrs), Avg:98.7 F (37.1 C), Min:98.5 F (36.9 C), Max:98.9 F (37.2 C)  Recent Labs  Lab 07/21/19 0028  07/22/19 0510 07/23/19 6734 07/24/19 0558 07/25/19 0526 07/25/19 1743 07/26/19 0316 07/26/19 0503 07/27/19 0742  WBC 26.8*  --  21.2* 18.2* 17.2* 15.2*  --   --   --   --   CREATININE  --    < > 0.56 0.62 0.58 0.67  --   --  0.75 0.71  VANCOTROUGH  --   --   --   --   --   --   --  11*  --   --   VANCOPEAK  --   --   --   --   --   --  26*  --   --   --    < > = values in this interval not displayed.    Estimated Creatinine Clearance: 81.1 mL/min (by C-G formula based on SCr of 0.71 mg/dL).    Allergies  Allergen Reactions  . Sulfa Antibiotics Shortness Of Breath    Antimicrobials this admission: Cefepime 10/29 >>  10/30 Metro 10/29 >>  10/30 Linezolid 10/30 >> 11/5 Vanc 10/29 >>  Dose adjustments this admission: 11/1 vanc 750 mg q12h to 1000 mg q12h 11/5 vanc 1000 mg q12h to 1250 mg q12h  Microbiology results: 10/29 BCx: Staphylococcus aureus -  MRSA-4of4 10/29 UCx: insignif 10/29 MRSA PCR: + 10/30 Facial abscess: MRSA  Thank you for allowing pharmacy to be a part of this patient's care.  Tawnya Crook, PharmD 07/27/2019 9:05 AM

## 2019-07-28 LAB — VANCOMYCIN, TROUGH: Vancomycin Tr: 14 ug/mL — ABNORMAL LOW (ref 15–20)

## 2019-07-28 LAB — VANCOMYCIN, PEAK: Vancomycin Pk: 43 ug/mL — ABNORMAL HIGH (ref 30–40)

## 2019-07-28 MED ORDER — VANCOMYCIN HCL 500 MG IV SOLR
500.0000 mg | Freq: Three times a day (TID) | INTRAVENOUS | Status: DC
  Administered 2019-07-28 – 2019-07-29 (×3): 500 mg via INTRAVENOUS
  Filled 2019-07-28 (×5): qty 500

## 2019-07-28 NOTE — Plan of Care (Signed)
  Problem: Clinical Measurements: Goal: Signs and symptoms of infection will decrease Outcome: Progressing   Problem: Education: Goal: Knowledge of General Education information will improve Description: Including pain rating scale, medication(s)/side effects and non-pharmacologic comfort measures Outcome: Progressing   Problem: Clinical Measurements: Goal: Ability to maintain clinical measurements within normal limits will improve Outcome: Progressing   Problem: Clinical Measurements: Goal: Ability to maintain clinical measurements within normal limits will improve Outcome: Progressing Goal: Will remain free from infection Outcome: Progressing   Problem: Nutrition: Goal: Adequate nutrition will be maintained Outcome: Progressing   Problem: Pain Managment: Goal: General experience of comfort will improve Outcome: Progressing   Problem: Safety: Goal: Ability to remain free from injury will improve Outcome: Progressing

## 2019-07-28 NOTE — Plan of Care (Signed)
  Problem: Clinical Measurements: Goal: Signs and symptoms of infection will decrease 07/28/2019 0148 by Herbie Baltimore, RN Outcome: Progressing 07/28/2019 0142 by Herbie Baltimore, RN Outcome: Progressing   Problem: Education: Goal: Knowledge of General Education information will improve Description: Including pain rating scale, medication(s)/side effects and non-pharmacologic comfort measures 07/28/2019 0148 by Herbie Baltimore, RN Outcome: Progressing 07/28/2019 0142 by Herbie Baltimore, RN Outcome: Progressing   Problem: Clinical Measurements: Goal: Ability to maintain clinical measurements within normal limits will improve 07/28/2019 0148 by Herbie Baltimore, RN Outcome: Progressing 07/28/2019 0142 by Herbie Baltimore, RN Outcome: Progressing Goal: Will remain free from infection 07/28/2019 0148 by Herbie Baltimore, RN Outcome: Progressing 07/28/2019 0142 by Herbie Baltimore, RN Outcome: Progressing   Problem: Nutrition: Goal: Adequate nutrition will be maintained 07/28/2019 0148 by Herbie Baltimore, RN Outcome: Progressing 07/28/2019 0142 by Herbie Baltimore, RN Outcome: Progressing   Problem: Pain Managment: Goal: General experience of comfort will improve 07/28/2019 0148 by Herbie Baltimore, RN Outcome: Progressing 07/28/2019 0142 by Herbie Baltimore, RN Outcome: Progressing   Problem: Safety: Goal: Ability to remain free from injury will improve 07/28/2019 0148 by Herbie Baltimore, RN Outcome: Progressing 07/28/2019 0142 by Herbie Baltimore, RN Outcome: Progressing

## 2019-07-28 NOTE — Consult Note (Signed)
PHARMACY CONSULT NOTE - FOLLOW UP  Pharmacy Consult for Electrolyte Monitoring and Replacement   Recent Labs: Potassium (mmol/L)  Date Value  07/27/2019 3.9   Magnesium (mg/dL)  Date Value  07/23/2019 2.2   Calcium (mg/dL)  Date Value  07/27/2019 8.0 (L)   Albumin (g/dL)  Date Value  07/22/2019 2.1 (L)   Sodium (mmol/L)  Date Value  07/27/2019 137   Correct Calcium: 9.52  Assessment: Pharmacy consulted for electrolyte monitoring and replacement for 38 yo female admitted with facial swelling, multifocal PNA (possible septic emboli) and MRSA bacteremia. Patient has known history of IV drug abuse.   Goal of Therapy:  Electrolytes WNL  Plan:  Electrolytes have remained stable. Will push BMP to 48 hours. If electrolytes remain stable, pharmacy will likely sign off.  Will continue to monitor renal function as clinically indicated for vancomycin.   Pharmacy will continue to monitor and replace electrolytes as needed.   Oswald Hillock, PharmD, BCPS Clinical Pharmacist 07/28/2019 7:18 AM

## 2019-07-28 NOTE — Progress Notes (Signed)
[place] at [facility]   PATIENT NAME: Eileen [name]    MR#:  [id number]  DATE OF BIRTH:  1981/02/08  SUBJECTIVE:  tolerating oral diet well No fever REVIEW OF SYSTEMS:  Review of Systems  Constitutional: Negative for diaphoresis, fever, malaise/fatigue and weight loss.  HENT: Negative for ear discharge, ear pain, hearing loss, nosebleeds, sore throat and tinnitus.   Eyes: Negative for blurred vision and pain.  Respiratory: Negative for cough, hemoptysis, shortness of breath and wheezing.   Cardiovascular: Negative for chest pain, palpitations, orthopnea and leg swelling.  Gastrointestinal: Negative for abdominal pain, blood in stool, constipation, diarrhea, heartburn, nausea and vomiting.  Genitourinary: Negative for dysuria, frequency and urgency.  Musculoskeletal: Negative for back pain and myalgias.  Skin: Negative for itching and rash.  Neurological: Positive for weakness. Negative for dizziness, tingling, tremors, focal weakness, seizures and headaches.  Psychiatric/Behavioral: Negative for depression. The patient is not nervous/anxious.    DRUG ALLERGIES:   Allergies  Allergen Reactions  . Sulfa Antibiotics Shortness Of Breath   VITALS:  Blood pressure 114/70, pulse 90, temperature 98 F (36.7 C), temperature source Oral, resp. rate 18, height 5\' 4"  (1.626 m), weight 54.4 kg, last menstrual period 02/16/2019, SpO2 100 %. PHYSICAL EXAMINATION:  Physical Exam HENT:     Head: Normocephalic and atraumatic.  Eyes:     Conjunctiva/sclera: Conjunctivae normal.     Pupils: Pupils are equal, round, and reactive to light.  Neck:     Musculoskeletal: Normal range of motion and neck supple.     Thyroid: No thyromegaly.     Trachea: No tracheal deviation.  Cardiovascular:     Rate and Rhythm: Normal rate and regular rhythm.     Heart sounds: Normal heart sounds.  Pulmonary:     Effort: Pulmonary effort is normal. No respiratory distress.     Breath sounds:  Normal breath sounds. No wheezing.  Chest:     Chest wall: No tenderness.  Abdominal:     General: Bowel sounds are normal. There is no distension.     Palpations: Abdomen is soft.     Tenderness: There is no abdominal tenderness.  Musculoskeletal: Normal range of motion.  Skin:    General: Skin is warm and dry.     Findings: No rash.     Comments: Rt side of the face swollen, erythematous Purulent wound with scab near the lip   Neurological:     Mental Status: She is alert and oriented to person, place, and time.     Cranial Nerves: No cranial nerve deficit.       Showing improvement LABORATORY PANEL:  Female CBC Recent Labs  Lab 07/25/19 0526  WBC 15.2*  HGB 8.9*  HCT 27.9*  PLT 473*   ------------------------------------------------------------------------------------------------------------------ Chemistries  Recent Labs  Lab 07/22/19 0510  07/23/19 1847  07/27/19 0742  NA 135   < >  --    < > 137  K 3.1*   < > 3.1*   < > 3.9  CL 104   < >  --    < > 103  CO2 21*   < >  --    < > 26  GLUCOSE 102*   < >  --    < > 95  BUN 8   < >  --    < > 11  CREATININE 0.56   < >  --    < > 0.71  CALCIUM 7.7*   < >  --    < >  8.0*  MG  --   --  2.2  --   --   AST 16  --   --   --   --   ALT 26  --   --   --   --   ALKPHOS 167*  --   --   --   --   BILITOT 0.7  --   --   --   --    < > = values in this interval not displayed.   RADIOLOGY:  No results found. ASSESSMENT AND PLAN:  Eileen [name]  is a 38 y.o. female with a known history of IV drug abuse and polysubstance abuse admitted with complaints of left-sided chest pain that hurts when she breathes. She also has right facial cellulitis worsening for past two days.  1. MRSA Sepsis secondary to multifocal pneumonia and right facial swelling/cellulitis in the setting of IV drug abuse (heroin) - continue IV vancomycin for 6 weeks and  Linezolid will be d/ced once vanc levels are therapuetic - appreciate ID input -  2D echo not showing any vegetation. TEE not be performed as she is not able to keep her mouth open completely. -Repeat CT maxillofacial on 11/2 shows improvement -Continue PRN pain meds -ID recommends 6 weeks of intravenous antibiotics for which patient will need to stay in the hospital as she does not have insurance so unlikely can be placed anywhere -ENT Dr Vaught's input appreciated-- patient is refusing to get I & D done. -Repeat CT 11/2 shows some improvement.  -IV Dilaudid and oral Percocet-- discontinue Suboxone (d/w pharmacy)  2. Multifocal pneumonia secondary to IV drug abuse -oxygen saturations normal -continue above antibiotics  3. Leukocytosis secondary to infection -follow CBC  4. Hypokalemia -repleted and resolved - pharmacy monitoring electrolytes  5. Heroin use/anxiety-  psych input appreciated -Continue gabapentin and hydroxyzine  Management plans discussed with the patient, nursing and they are in agreement.  CODE STATUS: Full Code  TOTAL TIME TAKING CARE OF THIS PATIENT: 25 minutes.   More than 50% of the time was spent in counseling/coordination of care: YES  POSSIBLE D/C IN 4 weeks, DEPENDING ON CLINICAL CONDITION.  Needs to complete 6 weeks of intravenous antibiotics here in the hospital per infectious disease   Fritzi Mandes M.D on 07/28/2019 at 1:52 PM  Between 7am to 6pm - Pager - [phone]  After 6pm go to [email or web address] - password [email or web address]  [facility]   CC: Primary care physician; Patient, No Pcp Per  Note: This dictation was prepared with Dragon dictation along with smaller phrase technology. Any transcriptional errors that result from this process are unintentional.

## 2019-07-28 NOTE — Progress Notes (Signed)
Pharmacy Antibiotic Note  Eileen [name] is a 38 y.o. female admitted on 07/19/2019 with pneumonia/facial cellulitis/ MRSA bacteremia. Concern for endocarditis, however patient refused TEE s/t pain and inability to open mouth. With multifocal PNA, question septic emboli. Pharmacy has been consulted for Vancomycin dosing.   Patient reportedly in [place] recently for MRSA bacteremia but left AMA.  Patient was on linezolid to bridge therapy until vancomycin therapeutic. Linezolid discontinued 11/5.  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  11/07:  Vanc peak @ 0656 = 43 mcg/mL             Vanc trough @ 1533 = 14 mcg/mL              Calc AUC = 812.1             T1/2 = 5.3 hrs             Vd = 23.6 L                Will adjust dose to Vancomycin 500 mg IV Q8H to start 11/07 @ 1700 Will draw peak and trough after 3rd new dose on 11/08 @ 0900. Vanc peak ordered for 11/08 @ 1030 Vanc trough ordered for 11/08 @ 1630  Height: 5\' 4"  (162.6 cm) Weight: 119 lb 14.9 oz (54.4 kg) IBW/kg (Calculated) : 54.7  Temp (24hrs), Avg:98.2 F (36.8 C), Min:98 F (36.7 C), Max:98.6 F (37 C)  Recent Labs  Lab 07/22/19 0510 07/23/19 3235 07/24/19 0558 07/25/19 0526 07/25/19 1743 07/26/19 0316 07/26/19 0503 07/27/19 0742 07/28/19 0656 07/28/19 1533  WBC 21.2* 18.2* 17.2* 15.2*  --   --   --   --   --   --   CREATININE 0.56 0.62 0.58 0.67  --   --  0.75 0.71  --   --   VANCOTROUGH  --   --   --   --   --  11*  --   --   --  14*  VANCOPEAK  --   --   --   --  26*  --   --   --  43*  --     Estimated Creatinine Clearance: 81.1 mL/min (by C-G formula based on SCr of 0.71 mg/dL).    Allergies  Allergen Reactions  .  Sulfa Antibiotics Shortness Of Breath    Antimicrobials this admission: Cefepime 10/29 >>  10/30 Metro 10/29 >>  10/30 Linezolid 10/30 >> 11/5 Vanc 10/29 >>  Dose adjustments this admission: 11/1 vanc 750 mg q12h to 1000 mg q12h 11/5 vanc 1000 mg q12h to 1250 mg q12h 11/7 vanc 1250 mg Q12H to 500 mg Q8H  Microbiology results: 10/29 BCx: Staphylococcus aureus - MRSA-4of4 10/29 UCx: insignif 10/29 MRSA PCR: + 10/30 Facial abscess: MRSA  Thank you for allowing pharmacy to be a part of this patient's care.  Tanesha Arambula D, PharmD 07/28/2019 4:35 PM

## 2019-07-29 LAB — BASIC METABOLIC PANEL
Anion gap: 9 (ref 5–15)
BUN: 12 mg/dL (ref 6–20)
CO2: 29 mmol/L (ref 22–32)
Calcium: 8.9 mg/dL (ref 8.9–10.3)
Chloride: 99 mmol/L (ref 98–111)
Creatinine, Ser: 0.69 mg/dL (ref 0.44–1.00)
GFR calc Af Amer: >60 mL/min (ref >60)
GFR calc non Af Amer: >60 mL/min (ref >60)
Glucose, Bld: 107 mg/dL — ABNORMAL HIGH (ref 70–99)
Potassium: 4.3 mmol/L (ref 3.5–5.1)
Sodium: 137 mmol/L (ref 135–145)

## 2019-07-29 MED ORDER — VANCOMYCIN HCL IN DEXTROSE 1-5 GM/200ML-% IV SOLN
1000.0000 mg | Freq: Two times a day (BID) | INTRAVENOUS | Status: DC
  Administered 2019-07-29 – 2019-08-02 (×8): 1000 mg via INTRAVENOUS
  Filled 2019-07-29 (×14): qty 200

## 2019-07-29 MED ORDER — HYDROMORPHONE HCL 1 MG/ML IJ SOLN
0.5000 mg | Freq: Three times a day (TID) | INTRAMUSCULAR | Status: DC | PRN
  Administered 2019-07-29 – 2019-07-31 (×6): 0.5 mg via INTRAVENOUS
  Filled 2019-07-29 (×6): qty 1

## 2019-07-29 MED ORDER — OXYCODONE-ACETAMINOPHEN 5-325 MG PO TABS
1.0000 | ORAL_TABLET | Freq: Four times a day (QID) | ORAL | Status: DC | PRN
  Administered 2019-07-29 – 2019-08-02 (×6): 1 via ORAL
  Filled 2019-07-29 (×6): qty 1

## 2019-07-29 NOTE — Progress Notes (Signed)
Pharmacy Antibiotic Note  Eileen [name] is a 38 y.o. female admitted on 07/19/2019 with pneumonia/facial cellulitis/ MRSA bacteremia. Concern for endocarditis, however patient refused TEE s/t pain and inability to open mouth. With multifocal PNA, question septic emboli. Pharmacy has been consulted for Vancomycin dosing.   Patient reportedly in [place] recently for MRSA bacteremia but left AMA.  Patient was on linezolid to bridge therapy until vancomycin therapeutic. Linezolid discontinued 11/5.  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 Calculated AUC 512.9. Day 8 of vancomycin.   Height: 5\' 4"  (162.6 cm) Weight: 119 lb 14.9 oz (54.4 kg) IBW/kg (Calculated) : 54.7  Temp (24hrs), Avg:98.1 F (36.7 C), Min:98 F (36.7 C), Max:98.2 F (36.8 C)  Recent Labs  Lab 07/23/19 0642 07/24/19 0558 07/25/19 0526 07/25/19 1743 07/26/19 0316 07/26/19 0503 07/27/19 0742 07/28/19 0656 07/28/19 1533 07/29/19 0430  WBC 18.2* 17.2* 15.2*  --   --   --   --   --   --   --   CREATININE 0.62 0.58 0.67  --   --  0.75 0.71  --   --  0.69  VANCOTROUGH  --   --   --   --  11*  --   --   --  14*  --   VANCOPEAK  --   --   --  26*  --   --   --  43*  --   --     Estimated Creatinine Clearance: 81.1 mL/min (by C-G formula based on SCr of 0.69 mg/dL).    Allergies  Allergen Reactions  . Sulfa Antibiotics Shortness Of Breath    Antimicrobials this admission: Cefepime 10/29 >>  10/30 Metro 10/29 >>  10/30 Linezolid 10/30 >> 11/5 Vanc 10/29 >>  Dose adjustments this admission: 11/1 vanc 750 mg q12h to 1000 mg q12h 11/5 vanc 1000  mg q12h to 1250 mg q12h 11/7 vanc 1250 mg Q12H to 500 mg Q8H 11/8 vancomycin 500 mg q8H to vancomycin 1000 mg q12H.   Microbiology results: 10/29 BCx: Staphylococcus aureus - MRSA-4of4 10/29 UCx: insignif 10/29 MRSA PCR: + 10/30 Facial abscess: MRSA  Thank you for allowing pharmacy to be a part of this patient's care.  Oswald Hillock, PharmD, BCPS 07/29/2019 10:14 AM

## 2019-07-29 NOTE — Plan of Care (Signed)
  Problem: Clinical Measurements: Goal: Signs and symptoms of infection will decrease Outcome: Progressing   Problem: Education: Goal: Knowledge of General Education information will improve Description: Including pain rating scale, medication(s)/side effects and non-pharmacologic comfort measures Outcome: Progressing   Problem: Clinical Measurements: Goal: Ability to maintain clinical measurements within normal limits will improve Outcome: Progressing Goal: Will remain free from infection Outcome: Progressing   Problem: Nutrition: Goal: Adequate nutrition will be maintained Outcome: Progressing   Problem: Pain Managment: Goal: General experience of comfort will improve Outcome: Progressing   Problem: Safety: Goal: Ability to remain free from injury will improve Outcome: Progressing   

## 2019-07-29 NOTE — Progress Notes (Signed)
   Date of Admission:  07/19/2019      ID: Eileen [name] is a 38 y.o. female  Principal Problem:   MRSA bacteremia Active Problems:   Polysubstance dependence including opioid drug with daily use ([place])   Sepsis ([place])   IV drug user   Facial cellulitis   Septic pulmonary embolism ([facility])   Anxiety   Lobar pneumonia ([facility])    Subjective: Feeling better Says swelling of the face is much improved  Medications:  . chlorhexidine  15 mL Mouth Rinse BID  . enoxaparin (LOVENOX) injection  40 mg Subcutaneous Daily  . gabapentin  300 mg Oral QID  . mouth rinse  15 mL Mouth Rinse q12n4p  . multivitamin with minerals  1 tablet Oral Daily  . nicotine  14 mg Transdermal Daily    Objective: Vital signs in last 24 hours: Temp:  [98 F (36.7 C)-98.2 F (36.8 C)] 98.2 F (36.8 C) (11/08 0414) Pulse Rate:  [91-100] 91 (11/08 0414) Resp:  [16-18] 16 (11/08 0414) BP: (114-137)/(71-98) 121/71 (11/08 0414) SpO2:  [97 %-100 %] 97 % (11/08 0414)  PHYSICAL EXAM:  General: Alert, cooperative, no distress, appears stated age.  Head: Normocephalic, without obvious abnormality, atraumatic. Eyes: Conjunctivae clear, anicteric sclerae. Pupils are equal ENT Nares normal. No drainage or sinus tenderness. Lips, mucosa, and tongue normal. No Thrush Right side of this face swelling was much improved minimal swelling.  able to open her mouth well Neck: Supple, symmetrical, no adenopathy, thyroid: non tender no carotid bruit and no JVD. Back: No CVA tenderness. Lungs: Clear to auscultation bilaterally. No Wheezing or Rhonchi. No rales. Heart: Regular rate and rhythm, no murmur, rub or gallop. Abdomen: Soft, non-tender,not distended. Bowel sounds normal. No masses Extremities: atraumatic, no cyanosis. No edema. No clubbing Skin: No rashes or lesions. Or bruising Lymph: Cervical, supraclavicular normal. Neurologic: Grossly non-focal  Lab Results Recent Labs    07/27/19 0742 07/29/19 0430  NA  137 137  K 3.9 4.3  CL 103 99  CO2 26 29  BUN 11 12  CREATININE 0.71 0.69   Liver Panel No results for input(s): PROT, ALBUMIN, AST, ALT, ALKPHOS, BILITOT, BILIDIR, IBILI in the last 72 hours. Sedimentation Rate No results for input(s): ESRSEDRATE in the last 72 hours. C-Reactive Protein No results for input(s): CRP in the last 72 hours.  Microbiology:  Studies/Results: No results found.   Assessment/Plan: MRSA bacteremia in a patient with IV drug use.  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  2D echo did not show any valve vegetations.  Repeat blood culture has been negative.  She was not able to get TEE last week because she was unable to open her mouth completely.  Now the facial swelling is almost resolved and she is able to open her mouth well we should get a TEE to rule out endocarditis.  Currently on vancomycin.  Depending on the TEE result the duration of antibiotic will be decided.  Facial cellulitis with soft tissue infection.  Has almost resolved.  She would not need surgery.  Hepatitis C antibody positive but RNA is negative. HIV nonreactive. Discussed the management with the patient in detail.

## 2019-07-29 NOTE — Progress Notes (Signed)
[place] at [place] NAME: Eileen [name]    MR#:  [id number]  DATE OF BIRTH:  May 13, 1981  SUBJECTIVE:  tolerating oral diet well No fever REVIEW OF SYSTEMS:  Review of Systems  Constitutional: Negative for diaphoresis, fever, malaise/fatigue and weight loss.  HENT: Negative for ear discharge, ear pain, hearing loss, nosebleeds, sore throat and tinnitus.   Eyes: Negative for blurred vision and pain.  Respiratory: Negative for cough, hemoptysis, shortness of breath and wheezing.   Cardiovascular: Negative for chest pain, palpitations, orthopnea and leg swelling.  Gastrointestinal: Negative for abdominal pain, blood in stool, constipation, diarrhea, heartburn, nausea and vomiting.  Genitourinary: Negative for dysuria, frequency and urgency.  Musculoskeletal: Negative for back pain and myalgias.  Skin: Negative for itching and rash.  Neurological: Positive for weakness. Negative for dizziness, tingling, tremors, focal weakness, seizures and headaches.  Psychiatric/Behavioral: Negative for depression. The patient is not nervous/anxious.    DRUG ALLERGIES:   Allergies  Allergen Reactions  . Sulfa Antibiotics Shortness Of Breath   VITALS:  Blood pressure 121/71, pulse 91, temperature 98.2 F (36.8 C), temperature source Oral, resp. rate 16, height 5\' 4"  (1.626 m), weight 54.4 kg, last menstrual period 02/16/2019, SpO2 97 %. PHYSICAL EXAMINATION:  Physical Exam HENT:     Head: Normocephalic and atraumatic.  Eyes:     Conjunctiva/sclera: Conjunctivae normal.     Pupils: Pupils are equal, round, and reactive to light.  Neck:     Musculoskeletal: Normal range of motion and neck supple.     Thyroid: No thyromegaly.     Trachea: No tracheal deviation.  Cardiovascular:     Rate and Rhythm: Normal rate and regular rhythm.     Heart sounds: Normal heart sounds.  Pulmonary:     Effort: Pulmonary effort is normal. No respiratory distress.     Breath sounds:  Normal breath sounds. No wheezing.  Chest:     Chest wall: No tenderness.  Abdominal:     General: Bowel sounds are normal. There is no distension.     Palpations: Abdomen is soft.     Tenderness: There is no abdominal tenderness.  Musculoskeletal: Normal range of motion.  Skin:    General: Skin is warm and dry.     Findings: No rash.     Comments: Rt side of the face swollen, erythematous Purulent wound with scab near the lip   Neurological:     Mental Status: She is alert and oriented to person, place, and time.     Cranial Nerves: No cranial nerve deficit.    Day 1   Showing improvement  On 07/29/2019    LABORATORY PANEL:  Female CBC Recent Labs  Lab 07/25/19 0526  WBC 15.2*  HGB 8.9*  HCT 27.9*  PLT 473*   ------------------------------------------------------------------------------------------------------------------ Chemistries  Recent Labs  Lab 07/23/19 1847  07/29/19 0430  NA  --    < > 137  K 3.1*   < > 4.3  CL  --    < > 99  CO2  --    < > 29  GLUCOSE  --    < > 107*  BUN  --    < > 12  CREATININE  --    < > 0.69  CALCIUM  --    < > 8.9  MG 2.2  --   --    < > = values in this interval not displayed.   RADIOLOGY:  No results found. ASSESSMENT  AND PLAN:  Eileen [name]  is a 38 y.o. female with a known history of IV drug abuse and polysubstance abuse admitted with complaints of left-sided chest pain that hurts when she breathes. She also has right facial cellulitis worsening for past two days.  1. MRSA Sepsis secondary to multifocal pneumonia and right facial swelling/cellulitis in the setting of IV drug abuse (heroin) - continue IV vancomycin for 6 weeks and  Linezolid will be d/ced once vanc levels are therapuetic - appreciate ID input - 2D echo not showing any vegetation. TEE not be performed as she is not able to keep her mouth open completely--maybe now that swelling is down can try attempt for TEE. -Repeat CT maxillofacial on 11/2 shows  improvement -Continue PRN pain meds -ID recommends 6 weeks of intravenous antibiotics for which patient will need to stay in the hospital as she does not have insurance so unlikely can be placed anywhere -ENT Dr Vaught's input appreciated-- patient is refusing to get I & D done. -Repeat CT 11/2 shows some improvement.  -IV Dilaudid and oral Percocet-- discontinue Suboxone (d/w pharmacy)  2. Multifocal pneumonia secondary to IV drug abuse -oxygen saturations normal -continue above antibiotics  3. Leukocytosis secondary to infection -follow CBC  4. Hypokalemia -repleted and resolved - pharmacy monitoring electrolytes  5. Heroin use/anxiety-  psych input appreciated -Continue gabapentin and hydroxyzine  Management plans discussed with the patient, nursing and they are in agreement.  CODE STATUS: Full Code  TOTAL TIME TAKING CARE OF THIS PATIENT: 25 minutes.   More than 50% of the time was spent in counseling/coordination of care: YES  POSSIBLE D/C IN 4 weeks, DEPENDING ON CLINICAL CONDITION.  Needs to complete 6 weeks of intravenous antibiotics here in the hospital per infectious disease   Enedina Finner M.D on 07/29/2019 at 9:48 AM  Between 7am to 6pm - Pager - [phone]  After 6pm go to [email or web address] - password [email or web address]  [facility]   CC: Primary care physician; Patient, No Pcp Per  Note: This dictation was prepared with Dragon dictation along with smaller phrase technology. Any transcriptional errors that result from this process are unintentional.

## 2019-07-29 NOTE — Consult Note (Signed)
PHARMACY CONSULT NOTE - FOLLOW UP  Pharmacy Consult for Electrolyte Monitoring and Replacement   Recent Labs: Potassium (mmol/L)  Date Value  07/29/2019 4.3   Magnesium (mg/dL)  Date Value  07/23/2019 2.2   Calcium (mg/dL)  Date Value  07/29/2019 8.9   Albumin (g/dL)  Date Value  07/22/2019 2.1 (L)   Sodium (mmol/L)  Date Value  07/29/2019 137   Correct Calcium: 9.52  Assessment: Pharmacy consulted for electrolyte monitoring and replacement for 38 yo female admitted with facial swelling, multifocal PNA (possible septic emboli) and MRSA bacteremia. Patient has known history of IV drug abuse.   Goal of Therapy:  Electrolytes WNL  Plan:  Electrolytes have remained stable. Pharmacy will sign off.  Pharmacy will continue to monitor and replace electrolytes as needed.   Oswald Hillock, PharmD, BCPS Clinical Pharmacist 07/29/2019 10:23 AM

## 2019-07-30 DIAGNOSIS — I76 Septic arterial embolism: Secondary | ICD-10-CM

## 2019-07-30 MED ORDER — CLONAZEPAM 0.5 MG PO TABS
0.5000 mg | ORAL_TABLET | Freq: Two times a day (BID) | ORAL | Status: DC
  Administered 2019-07-30 – 2019-08-02 (×7): 0.5 mg via ORAL
  Filled 2019-07-30 (×7): qty 1

## 2019-07-30 MED ORDER — GABAPENTIN 300 MG PO CAPS: 300.0000 mg | ORAL_CAPSULE | Freq: Two times a day (BID) | ORAL | Status: DC

## 2019-07-30 MED ORDER — GABAPENTIN 300 MG PO CAPS
300.0000 mg | ORAL_CAPSULE | Freq: Two times a day (BID) | ORAL | Status: DC
  Administered 2019-07-30: 300 mg via ORAL

## 2019-07-30 MED ORDER — SODIUM CHLORIDE 0.9 % IV SOLN
INTRAVENOUS | Status: DC | PRN
  Administered 2019-07-30 – 2019-07-31 (×3): 250 mL via INTRAVENOUS

## 2019-07-30 NOTE — Plan of Care (Signed)
  Problem: Clinical Measurements: Goal: Signs and symptoms of infection will decrease Outcome: Progressing   Problem: Education: Goal: Knowledge of General Education information will improve Description: Including pain rating scale, medication(s)/side effects and non-pharmacologic comfort measures Outcome: Progressing   Problem: Clinical Measurements: Goal: Ability to maintain clinical measurements within normal limits will improve Outcome: Progressing Goal: Will remain free from infection Outcome: Progressing   Problem: Nutrition: Goal: Adequate nutrition will be maintained Outcome: Progressing   Problem: Pain Managment: Goal: General experience of comfort will improve Outcome: Progressing   Problem: Safety: Goal: Ability to remain free from injury will improve Outcome: Progressing

## 2019-07-30 NOTE — Progress Notes (Signed)
   Date of Admission:  07/19/2019      ID: Eileen [name] is a 38 y.o. female  Principal Problem:   MRSA bacteremia Active Problems:   Polysubstance dependence including opioid drug with daily use ([place])   Sepsis ([place])   IV drug user   Facial cellulitis   Septic pulmonary embolism ([facility])   Anxiety   Lobar pneumonia ([place])   Septic embolism (H[facility])    Subjective: Doing better Swelling of the face better able to open her mouth better Still complains of pain in the lower part of her chest on the left side and sometimes into her left shoulder  Medications:  . chlorhexidine  15 mL Mouth Rinse BID  . clonazePAM  0.5 mg Oral BID  . enoxaparin (LOVENOX) injection  40 mg Subcutaneous Daily  . mouth rinse  15 mL Mouth Rinse q12n4p  . multivitamin with minerals  1 tablet Oral Daily  . nicotine  14 mg Transdermal Daily    Objective: Vital signs in last 24 hours: Temp:  [97.9 F (36.6 C)-98.9 F (37.2 C)] 98.3 F (36.8 C) (11/09 0739) Pulse Rate:  [88-109] 88 (11/09 0739) Resp:  [18] 18 (11/09 0739) BP: (106-121)/(76-98) 119/84 (11/09 0739) SpO2:  [99 %-100 %] 99 % (11/09 0739)  PHYSICAL EXAM:  General: Alert, cooperative, no distress, appears stated age.  Head: Normocephalic, without obvious abnormality, atraumatic. Eyes: Conjunctivae clear, anicteric sclerae. Pupils are equal ENT Nares normal. No drainage or sinus tenderness. Lips, mucosa, and tongue normal. No Thrush Right face swelling is much improved The wound on the angle of the lip has healed Today -07/30/19   On Admission    Neck: Supple, symmetrical, no adenopathy, thyroid: non tender no carotid bruit and no JVD. Back: No CVA tenderness. Lungs: b/l air entry-  Heart: Regular rate and rhythm, no murmur, rub or gallop. Abdomen: Soft, non-tender,not distended. Bowel sounds normal. No masses Extremities: atraumatic, no cyanosis. No edema. No clubbing Skin: No rashes or lesions. Or bruising Lymph: Cervical,  supraclavicular normal. Neurologic: Grossly non-focal  Lab Results Recent Labs    07/29/19 0430  NA 137  K 4.3  CL 99  CO2 29  BUN 12  CREATININE 0.69   Microbiology: 07/19/2019 blood culture MRSA 07/21/2019 blood culture no growth 07/20/2019 wound culture MRSA   Assessment/Plan: MRSA bacteremia in a patient with IV drug use.  Repeat blood cultures from 07/21/2019 48 hours from the initial 1 has been negative.  2D echo negative.  Because of multifocal pneumonia there is a concern for septic emboli from the  tricuspid valve.  Hence she will be getting TEE. Continue vancomycin.  Depending on the TEE result the duration of antibiotic will be decided.  Right facial cellulitis and wound with severe swelling is much improved now.  There is some residual swelling but she is able to open her mouth a lot better.  So she will be able to get TEE.  Hepatitis C antibody positive but RNA is negative. HIV nonreactive Discussed the management with the patient and Dr. Posey Pronto.

## 2019-07-30 NOTE — Progress Notes (Signed)
[facility]   PATIENT NAME: Eileen [name]    MR#:  [id number]  DATE OF BIRTH:  December 28, 1980  SUBJECTIVE:  tolerating oral diet well No fever C/o pain all over Sleeping during the day more. Had Bm yday.  REVIEW OF SYSTEMS:  Review of Systems  Constitutional: Negative for diaphoresis, fever, malaise/fatigue and weight loss.  HENT: Negative for ear discharge, ear pain, hearing loss, nosebleeds, sore throat and tinnitus.   Eyes: Negative for blurred vision and pain.  Respiratory: Negative for cough, hemoptysis, shortness of breath and wheezing.   Cardiovascular: Negative for chest pain, palpitations, orthopnea and leg swelling.  Gastrointestinal: Negative for abdominal pain, blood in stool, constipation, diarrhea, heartburn, nausea and vomiting.  Genitourinary: Negative for dysuria, frequency and urgency.  Musculoskeletal: Positive for joint pain and myalgias. Negative for back pain.  Skin: Negative for itching and rash.  Neurological: Positive for weakness. Negative for dizziness, tingling, tremors, focal weakness, seizures and headaches.  Psychiatric/Behavioral: Negative for depression. The patient is not nervous/anxious.    DRUG ALLERGIES:   Allergies  Allergen Reactions  . Sulfa Antibiotics Shortness Of Breath   VITALS:  Blood pressure 119/84, pulse 88, temperature 98.3 F (36.8 C), resp. rate 18, height 5\' 4"  (1.626 m), weight 54.4 kg, last menstrual period 02/16/2019, SpO2 99 %. PHYSICAL EXAMINATION:  Physical Exam HENT:     Head: Normocephalic and atraumatic.  Eyes:     Conjunctiva/sclera: Conjunctivae normal.     Pupils: Pupils are equal, round, and reactive to light.  Neck:     Musculoskeletal: Normal range of motion and neck supple.     Thyroid: No thyromegaly.     Trachea: No tracheal deviation.  Cardiovascular:     Rate and Rhythm: Normal rate and regular rhythm.     Heart sounds: Normal heart sounds.  Pulmonary:     Effort: Pulmonary  effort is normal. No respiratory distress.     Breath sounds: Normal breath sounds. No wheezing.  Chest:     Chest wall: No tenderness.  Abdominal:     General: Bowel sounds are normal. There is no distension.     Palpations: Abdomen is soft.     Tenderness: There is no abdominal tenderness.  Musculoskeletal: Normal range of motion.  Skin:    General: Skin is warm and dry.     Findings: No rash.     Comments: Rt side of the face swollen, erythematous Purulent wound with scab near the lip   Neurological:     Mental Status: She is alert and oriented to person, place, and time.     Cranial Nerves: No cranial nerve deficit.    Day 1   Showing improvement  On 07/29/2019    LABORATORY PANEL:  Female CBC Recent Labs  Lab 07/25/19 0526  WBC 15.2*  HGB 8.9*  HCT 27.9*  PLT 473*   ------------------------------------------------------------------------------------------------------------------ Chemistries  Recent Labs  Lab 07/23/19 1847  07/29/19 0430  NA  --    < > 137  K 3.1*   < > 4.3  CL  --    < > 99  CO2  --    < > 29  GLUCOSE  --    < > 107*  BUN  --    < > 12  CREATININE  --    < > 0.69  CALCIUM  --    < > 8.9  MG 2.2  --   --    < > =  values in this interval not displayed.   RADIOLOGY:  No results found. ASSESSMENT AND PLAN:  Eileen [name]  is a 38 y.o. female with a known history of IV drug abuse and polysubstance abuse admitted with complaints of left-sided chest pain that hurts when she breathes. She also has right facial cellulitis worsening for past two days.  1. MRSA Sepsis secondary to multifocal pneumonia and right facial swelling/cellulitis in the setting of IV drug abuse (heroin) - continue IV vancomycin for 6 weeks and  Linezolid will be d/ced once vanc levels are therapuetic - appreciate ID input - 2D echo not showing any vegetation. TEE not be performed as she is not able to keep her mouth open completely--maybe now that swelling is down can  try attempt for TEE--dr arida informed -Repeat CT maxillofacial on 11/2 shows improvement -Continue PRN pain meds -ID recommends 4- 6 weeks of intravenous antibiotics for which patient will need to stay in the hospital as she does not have insurance so unlikely can be placed anywhere -ENT Dr Vaught's input appreciated-- patient is refusing to get I & D done. -Repeat CT 11/2 shows some improvement.  -IV Dilaudid and oral Percocet  2. Multifocal pneumonia secondary to IV drug abuse -oxygen saturations normal -continue above antibiotics  3. Leukocytosis secondary to infection -follow CBC  4. Hypokalemia -repleted and resolved - pharmacy monitoring electrolytes  5. Heroin use/anxiety-  psych input appreciated -Continue gabapentin and hydroxyzine -decreased dose of gabapentin to 300 mg bid --dr Caroline More informed.  6. Hepatitis C positive but RNA negative  Management plans discussed with the patient, nursing and they are in agreement.  CODE STATUS: Full Code  TOTAL TIME TAKING CARE OF THIS PATIENT: 25 minutes.   More than 50% of the time was spent in counseling/coordination of care: YES  POSSIBLE D/C IN 4 weeks, DEPENDING ON CLINICAL CONDITION.  Needs to complete 6 weeks of intravenous antibiotics here in the hospital per infectious disease   Fritzi Mandes M.D on 07/30/2019 at 10:14 AM  Between 7am to 6pm - Pager - [phone]  After 6pm go to [email or web address] - password [email or web address]  [facility]   CC: Primary care physician; Patient, No Pcp Per  Note: This dictation was prepared with Dragon dictation along with smaller phrase technology. Any transcriptional errors that result from this process are unintentional.

## 2019-07-30 NOTE — H&P (View-Only) (Signed)
Progress Note  Patient Name: Eileen [name] Date of Encounter: 07/30/2019  Primary Cardiologist: No primary care provider on file.   Subjective   We were notified again about proceeding with TEE.  This could not be done last week due to significant swelling on the right side of the face and she could not open her mouth enough for the TEE. She feels better now and the swelling has improved significantly.  No chest pain or shortness of breath.  Inpatient Medications    Scheduled Meds: . chlorhexidine  15 mL Mouth Rinse BID  . enoxaparin (LOVENOX) injection  40 mg Subcutaneous Daily  . gabapentin  300 mg Oral BID  . mouth rinse  15 mL Mouth Rinse q12n4p  . multivitamin with minerals  1 tablet Oral Daily  . nicotine  14 mg Transdermal Daily   Continuous Infusions: . sodium chloride 250 mL (07/30/19 1025)  . vancomycin 1,000 mg (07/30/19 1034)   PRN Meds: sodium chloride, acetaminophen **OR** acetaminophen, HYDROmorphone (DILAUDID) injection, hydrOXYzine, ondansetron **OR** ondansetron (ZOFRAN) IV, oxyCODONE-acetaminophen, sodium chloride flush   Vital Signs    Vitals:   07/29/19 1605 07/29/19 2127 07/30/19 0544 07/30/19 0739  BP: 121/89 (!) 121/98 106/76 119/84  Pulse: (!) 109 (!) 105 (!) 101 88  Resp: 18   18  Temp: 98.7 F (37.1 C) 97.9 F (36.6 C) 98.9 F (37.2 C) 98.3 F (36.8 C)  TempSrc: Oral Oral Oral   SpO2: 100% 100% 99% 99%  Weight:      Height:        Intake/Output Summary (Last 24 hours) at 07/30/2019 1233 Last data filed at 07/29/2019 2300 Gross per 24 hour  Intake -  Output 1 ml  Net -1 ml   Last 3 Weights 07/23/2019 07/19/2019 07/19/2019  Weight (lbs) 119 lb 14.9 oz 120 lb 120 lb  Weight (kg) 54.4 kg 54.432 kg 54.432 kg  Some encounter information is confidential and restricted. Go to Review Flowsheets activity to see all data.      Telemetry     - Personally Reviewed  ECG     - Personally Reviewed  Physical Exam   GEN: No acute  distress.   Neck: No JVD Cardiac: RRR, no murmurs, rubs, or gallops.  Respiratory: Clear to auscultation bilaterally. GI: Soft, nontender, non-distended  MS: No edema; No deformity. Neuro:  Nonfocal  Psych: Normal affect   Labs    High Sensitivity Troponin:  No results for input(s): TROPONINIHS in the last 720 hours.    Chemistry Recent Labs  Lab 07/25/19 0526 07/26/19 0503 07/27/19 0742 07/29/19 0430  NA 139  --  137 137  K 3.6  --  3.9 4.3  CL 106  --  103 99  CO2 25  --  26 29  GLUCOSE 123*  --  95 107*  BUN 9  --  11 12  CREATININE 0.67 0.75 0.71 0.69  CALCIUM 7.8*  --  8.0* 8.9  GFRNONAA >60 >60 >60 >60  GFRAA >60 >60 >60 >60  ANIONGAP 8  --  8 9     Hematology Recent Labs  Lab 07/24/19 0558 07/25/19 0526  WBC 17.2* 15.2*  RBC 3.65* 3.41*  HGB 9.7* 8.9*  HCT 29.2* 27.9*  MCV 80.0 81.8  MCH 26.6 26.1  MCHC 33.2 31.9  RDW 16.7* 17.1*  PLT 475* 473*    BNPNo results for input(s): BNP, PROBNP in the last 168 hours.   DDimer No results for input(s): DDIMER in  the last 168 hours.   Radiology    No results found.  Cardiac Studies   Transthoracic echocardiogram done on October 30 of 2020:    1. Left ventricular ejection fraction, by visual estimation, is 60 to 65%. The left ventricle has normal function. There is no left ventricular hypertrophy.  2. Global right ventricle has normal systolic function.The right ventricular size is normal. No increase in right ventricular wall thickness.  3. Left atrial size was normal.  4. Mild mitral valve regurgitation.  5. Tricuspid valve regurgitation is mild.  6. Normal pulmonary artery systolic pressure.  Patient Profile     38 y.o. female with a known history of IV drug abuse and polysubstance abuse admitted with complaints of left-sided pleuritic chest pain and was found to have MRSA bacteremia due to multifocal pneumonia and right facial cellulitis.   Assessment & Plan    1.  MRSA sepsis likely due to  multifocal pneumonia and right facial cellulitis in the setting of IV drug use.  Transthoracic echocardiogram did not show significant valvular abnormalities. Given MRSA bacteremia, I agree with a transesophageal echocardiogram.  This could not be done last week as she could not open her mouth fully.  She seems to be much better now.  I discussed the procedure with her again as well as risks and benefits and she is willing to proceed.  She ate today and thus will arrange for it to be done tomorrow.     For questions or updates, please contact [facility] Please consult [email or web address] for contact info under        Signed, Lorine Bears, MD  07/30/2019, 12:33 PM

## 2019-07-30 NOTE — Consult Note (Signed)
[facility] Face-to-Face Psychiatry Consult   Reason for Consult: Opiate dependency Referring Physician:  Dr Sherryll Burger Patient Identification: Eileen [name] MRN:  [id number]  Principal Diagnosis: Bacteremia  diagnosis:  Principal Problem:   MRSA bacteremia Active Problems:   Polysubstance dependence including opioid drug with daily use ([facility])   Sepsis ([facility]CC)   IV drug user   Facial cellulitis   Septic pulmonary embolism (HCC)   Anxiety   Lobar pneumonia (HCC)   Septic embolism (HCC)  Total Time spent with patient: 1 hour    07/30/2019 Update: Patient with significant anxiety. Expressing how her anxiety causes her to relapse. Patient requesting short acting xanax. She was explained how this medication is addicting and potentially dangerous due to concomitant opoid use. Patient with adverse reactions to SSRI treatment in the past. Agreeable to low dose, long acting benzo with less addiction potential.     Subjective:   Eileen [name] is a 38 y.o. female patient admitted with bacteremia and facial cellulitis.  "If you can't give me my pain medicine, I don't want anything. I was using heroin and I'm cold [place]!"  Patient seen and evaluated in person by this provider.  History of opiate dependency and admitted for bacteremia and facial cellulitis related to IV drug use.  She was also positive for amphetamines on admission.  Irritable on assessment and angry about not having the pain medication she wants.  Attempted to talk to the client with minimal success.  Tried to discuss Suboxone and she only wanted her pain medicine.  HPI per MD:   HISTORY OF PRESENT ILLNESS:  Eileen [name]  is a 37 y.o. female with a known history of IV drug abuse and polysubstance abuse comes to the emergency room with complaints of left-sided chest pain that hurts when she breathes. She also has right facial cellulitis worsening for past two days. She uses IV drug and last used was today. She used IV heroin. She reports  that the month ago she was at the hospital in [place] by they told her she had bacteria and her blood ended up living against medical advice and not taken any antibiotics thereafter.  Past Psychiatric History: polysubstance dependence  Risk to Self:  none Risk to Others:  none Prior Inpatient Therapy:  yes Prior Outpatient Therapy:   not currently  Past Medical History:  Past Medical History:  Diagnosis Date  . Anxiety   . IVDU (intravenous drug user)   . Polysubstance abuse [facility])     Past Surgical History:  Procedure Laterality Date  . TEE WITHOUT CARDIOVERSION N/A 07/23/2019   Procedure: TRANSESOPHAGEAL ECHOCARDIOGRAM (TEE);  Surgeon: Iran Ouch, MD;  Location: [facility] ORS;  Service: Cardiovascular;  Laterality: N/A;   Family History:  Family History  Problem Relation Age of Onset  . Brain cancer Father   . Brain cancer Brother    Family Psychiatric  History: None Social History:  Social History   Substance and Sexual Activity  Alcohol Use Yes   Comment: BAC not available at time of writing     Social History   Substance and Sexual Activity  Drug Use Yes  . Types: IV, Heroin, Cocaine, Methamphetamines   Comment: UDS not available at time of writing    Social History   Socioeconomic History  . Marital status: Legally Separated    Spouse name: Not on file  . Number of children: Not on file  . Years of education: Not on file  . Highest education level: Not on  file  Occupational History  . Not on file  Social Needs  . Financial resource strain: Not on file  . Food insecurity    Worry: Not on file    Inability: Not on file  . Transportation needs    Medical: Not on file    Non-medical: Not on file  Tobacco Use  . Smoking status: Current Every Day Smoker    Packs/day: 1.00    Types: Cigarettes  . Smokeless tobacco: Never Used  . Tobacco comment: Pt refused   Substance and Sexual Activity  . Alcohol use: Yes    Comment: BAC not available at time of  writing  . Drug use: Yes    Types: IV, Heroin, Cocaine, Methamphetamines    Comment: UDS not available at time of writing  . Sexual activity: Yes  Lifestyle  . Physical activity    Days per week: Not on file    Minutes per session: Not on file  . Stress: Not on file  Relationships  . Social Musicianconnections    Talks on phone: Not on file    Gets together: Not on file    Attends religious service: Not on file    Active member of club or organization: Not on file    Attends meetings of clubs or organizations: Not on file    Relationship status: Not on file  Other Topics Concern  . Not on file  Social History Narrative  . Not on file   Additional Social History:    Allergies:   Allergies  Allergen Reactions  . Sulfa Antibiotics Shortness Of Breath    Labs:  Results for orders placed or performed during the hospital encounter of 07/19/19 (from the past 48 hour(s))  Vancomycin, trough     Status: Abnormal   Collection Time: 07/28/19  3:33 PM  Result Value Ref Range   Vancomycin Tr 14 (L) 15 - 20 ug/mL    Comment: Performed at [facility], [address] [place], [place] [address]  Basic metabolic panel     Status: Abnormal   Collection Time: 07/29/19  4:30 AM  Result Value Ref Range   Sodium 137 135 - 145 mmol/L   Potassium 4.3 3.5 - 5.1 mmol/L   Chloride 99 98 - 111 mmol/L   CO2 29 22 - 32 mmol/L   Glucose, Bld 107 (H) 70 - 99 mg/dL   BUN 12 6 - 20 mg/dL   Creatinine, Ser 6.210.69 0.44 - 1.00 mg/dL   Calcium 8.9 8.9 - 30.810.3 mg/dL   GFR calc non Af Amer >60 >60 mL/min   GFR calc Af Amer >60 >60 mL/min   Anion gap 9 5 - 15    Comment: Performed at [facility], [address] [place], [place] [address]    Current Facility-Administered Medications  Medication Dose Route Frequency Provider Last Rate Last Dose  . 0.9 %  sodium chloride infusion   Intravenous PRN Enedina FinnerPatel, Sona, MD 10 mL/hr at 07/30/19 1025 250 mL at 07/30/19 1025  . acetaminophen (TYLENOL)  tablet 650 mg  650 mg Oral Q6H PRN Enedina FinnerPatel, Sona, MD   650 mg at 07/21/19 2031   Or  . acetaminophen (TYLENOL) suppository 650 mg  650 mg Rectal Q6H PRN Enedina FinnerPatel, Sona, MD      . chlorhexidine (PERIDEX) 0.12 % solution 15 mL  15 mL Mouth Rinse BID Delfino LovettShah, Vipul, MD   15 mL at 07/30/19 1036  . clonazePAM (KLONOPIN) tablet 0.5 mg  0.5  mg Oral BID Eileen Croswell A, MD      . enoxaparin (LOVENOX) injection 40 mg  40 mg Subcutaneous Daily Max Sane, MD   40 mg at 07/30/19 1035  . HYDROmorphone (DILAUDID) injection 0.5 mg  0.5 mg Intravenous Q8H PRN Fritzi Mandes, MD   0.5 mg at 07/30/19 1158  . hydrOXYzine (ATARAX/VISTARIL) tablet 25 mg  25 mg Oral TID PRN Patrecia Pour, NP   25 mg at 07/22/19 2025  . MEDLINE mouth rinse  15 mL Mouth Rinse q12n4p Max Sane, MD   15 mL at 07/27/19 1654  . multivitamin with minerals tablet 1 tablet  1 tablet Oral Daily Max Sane, MD   1 tablet at 07/30/19 1027  . nicotine (NICODERM CQ - dosed in mg/24 hours) patch 14 mg  14 mg Transdermal Daily Lance Coon, MD   14 mg at 07/30/19 1035  . ondansetron (ZOFRAN) tablet 4 mg  4 mg Oral Q6H PRN Fritzi Mandes, MD       Or  . ondansetron [facility]) injection 4 mg  4 mg Intravenous Q6H PRN Fritzi Mandes, MD      . oxyCODONE-acetaminophen (PERCOCET/ROXICET) 5-325 MG per tablet 1 tablet  1 tablet Oral Q6H PRN Fritzi Mandes, MD   1 tablet at 07/29/19 2116  . sodium chloride flush (NS) 0.9 % injection 10-40 mL  10-40 mL Intracatheter PRN Max Sane, MD      . vancomycin (VANCOCIN) IVPB 1000 mg/200 mL premix  1,000 mg Intravenous Q12H Fritzi Mandes, MD 200 mL/hr at 07/30/19 1034 1,000 mg at 07/30/19 1034    Musculoskeletal: Strength & Muscle Tone: within normal limits Gait & Station: normal Patient leans: N/A  Psychiatric Specialty Exam: Physical Exam  Nursing note and vitals reviewed. Constitutional: She is oriented to person, place, and time. She appears well-developed and well-nourished.  Neck: Normal range of motion.   Respiratory: Effort normal.  Musculoskeletal: Normal range of motion.  Neurological: She is alert and oriented to person, place, and time.  Psychiatric: Her speech is normal. Judgment and thought content normal. Her mood appears anxious. Her affect is angry. She is agitated. Cognition and memory are normal.    Review of Systems  HENT:       Facial pain  Musculoskeletal: Positive for myalgias.  Psychiatric/Behavioral: Positive for substance abuse. The patient is nervous/anxious.   All other systems reviewed and are negative.   Blood pressure 119/84, pulse 88, temperature 98.3 F (36.8 C), resp. rate 18, height 5\' 4"  (1.626 m), weight 54.4 kg, last menstrual period 02/16/2019, SpO2 99 %.Body mass index is 20.59 kg/m.  General Appearance: Disheveled  Eye Contact:  Fair  Speech:  Normal Rate  Volume:  Increased  Mood:  Angry, Anxious and Irritable  Affect:  Congruent  Thought Process:  Coherent and Descriptions of Associations: Intact  Orientation:  Full (Time, Place, and Person)  Thought Content:  Logical  Suicidal Thoughts:  No  Homicidal Thoughts:  No  Memory:  Immediate;   Fair Recent;   Fair Remote;   Fair  Judgement:  Poor  Insight:  Lacking  Psychomotor Activity:  Normal  Concentration:  Concentration: Fair and Attention Span: Fair  Recall:  [employer] of Knowledge:  Fair  Language:  Fair  Akathisia:  No  Handed:  Right  AIMS (if indicated):     Assets:  Leisure Time Resilience  ADL's:  Intact  Cognition:  WNL  Sleep:      38 year old female admitted for shortness  of breath and pain related to facial cellulitis and IV drug use.  Irritable on assessment demanding pain medications.  No suicidal/homicidal ideations, hallucinations.  She does report nausea, muscle aches, and chills related to opiate withdrawal.  Anxiety level high.  Treatment Plan Summary: Daily contact with patient to assess and evaluate symptoms and progress in treatment, Medication management and  Plan Opiate dependency:   Anxiety and withdrawal symptoms:   -Discontinue  gabapentin 300 mg 4 times daily -discontinue hydroxyzine 25 mg   Start clonazepam 0.5mg  BID  Disposition: No evidence of imminent risk to self or others at present.   Supportive therapy provided about ongoing stressors.  Clement Sayres, MD 07/30/2019 2:11 PM

## 2019-07-30 NOTE — Progress Notes (Signed)
Progress Note  Patient Name: Eileen [name] Date of Encounter: 07/30/2019  Primary Cardiologist: No primary care provider on file.   Subjective   We were notified again about proceeding with TEE.  This could not be done last week due to significant swelling on the right side of the face and she could not open her mouth enough for the TEE. She feels better now and the swelling has improved significantly.  No chest pain or shortness of breath.  Inpatient Medications    Scheduled Meds: . chlorhexidine  15 mL Mouth Rinse BID  . enoxaparin (LOVENOX) injection  40 mg Subcutaneous Daily  . gabapentin  300 mg Oral BID  . mouth rinse  15 mL Mouth Rinse q12n4p  . multivitamin with minerals  1 tablet Oral Daily  . nicotine  14 mg Transdermal Daily   Continuous Infusions: . sodium chloride 250 mL (07/30/19 1025)  . vancomycin 1,000 mg (07/30/19 1034)   PRN Meds: sodium chloride, acetaminophen **OR** acetaminophen, HYDROmorphone (DILAUDID) injection, hydrOXYzine, ondansetron **OR** ondansetron (ZOFRAN) IV, oxyCODONE-acetaminophen, sodium chloride flush   Vital Signs    Vitals:   07/29/19 1605 07/29/19 2127 07/30/19 0544 07/30/19 0739  BP: 121/89 (!) 121/98 106/76 119/84  Pulse: (!) 109 (!) 105 (!) 101 88  Resp: 18   18  Temp: 98.7 F (37.1 C) 97.9 F (36.6 C) 98.9 F (37.2 C) 98.3 F (36.8 C)  TempSrc: Oral Oral Oral   SpO2: 100% 100% 99% 99%  Weight:      Height:        Intake/Output Summary (Last 24 hours) at 07/30/2019 1233 Last data filed at 07/29/2019 2300 Gross per 24 hour  Intake -  Output 1 ml  Net -1 ml   Last 3 Weights 07/23/2019 07/19/2019 07/19/2019  Weight (lbs) 119 lb 14.9 oz 120 lb 120 lb  Weight (kg) 54.4 kg 54.432 kg 54.432 kg  Some encounter information is confidential and restricted. Go to Review Flowsheets activity to see all data.      Telemetry     - Personally Reviewed  ECG     - Personally Reviewed  Physical Exam   GEN: No acute  distress.   Neck: No JVD Cardiac: RRR, no murmurs, rubs, or gallops.  Respiratory: Clear to auscultation bilaterally. GI: Soft, nontender, non-distended  MS: No edema; No deformity. Neuro:  Nonfocal  Psych: Normal affect   Labs    High Sensitivity Troponin:  No results for input(s): TROPONINIHS in the last 720 hours.    Chemistry Recent Labs  Lab 07/25/19 0526 07/26/19 0503 07/27/19 0742 07/29/19 0430  NA 139  --  137 137  K 3.6  --  3.9 4.3  CL 106  --  103 99  CO2 25  --  26 29  GLUCOSE 123*  --  95 107*  BUN 9  --  11 12  CREATININE 0.67 0.75 0.71 0.69  CALCIUM 7.8*  --  8.0* 8.9  GFRNONAA >60 >60 >60 >60  GFRAA >60 >60 >60 >60  ANIONGAP 8  --  8 9     Hematology Recent Labs  Lab 07/24/19 0558 07/25/19 0526  WBC 17.2* 15.2*  RBC 3.65* 3.41*  HGB 9.7* 8.9*  HCT 29.2* 27.9*  MCV 80.0 81.8  MCH 26.6 26.1  MCHC 33.2 31.9  RDW 16.7* 17.1*  PLT 475* 473*    BNPNo results for input(s): BNP, PROBNP in the last 168 hours.   DDimer No results for input(s): DDIMER in  the last 168 hours.   Radiology    No results found.  Cardiac Studies   Transthoracic echocardiogram done on October 30 of 2020:    1. Left ventricular ejection fraction, by visual estimation, is 60 to 65%. The left ventricle has normal function. There is no left ventricular hypertrophy.  2. Global right ventricle has normal systolic function.The right ventricular size is normal. No increase in right ventricular wall thickness.  3. Left atrial size was normal.  4. Mild mitral valve regurgitation.  5. Tricuspid valve regurgitation is mild.  6. Normal pulmonary artery systolic pressure.  Patient Profile     38 y.o. female with a known history of IV drug abuse and polysubstance abuse admitted with complaints of left-sided pleuritic chest pain and was found to have MRSA bacteremia due to multifocal pneumonia and right facial cellulitis.   Assessment & Plan    1.  MRSA sepsis likely due to  multifocal pneumonia and right facial cellulitis in the setting of IV drug use.  Transthoracic echocardiogram did not show significant valvular abnormalities. Given MRSA bacteremia, I agree with a transesophageal echocardiogram.  This could not be done last week as she could not open her mouth fully.  She seems to be much better now.  I discussed the procedure with her again as well as risks and benefits and she is willing to proceed.  She ate today and thus will arrange for it to be done tomorrow.     For questions or updates, please contact [facility] Please consult [email or web address] for contact info under        Signed, Lorine Bears, MD  07/30/2019, 12:33 PM

## 2019-07-30 NOTE — Progress Notes (Signed)
Patient left the hospital and was found coming back from the parking lot with her visitor, Oncology director LaDonna McFarland notified after patient was returned to room and discussed incident with patient.   

## 2019-07-31 ENCOUNTER — Encounter
Admission: EM | Disposition: A | Discharge status: Home / Self Care | Payer: Self-pay | Source: Home / Self Care | Attending: Internal Medicine

## 2019-07-31 ENCOUNTER — Inpatient Hospital Stay (HOSPITAL_COMMUNITY)
Admit: 2019-07-31 | Discharge: 2019-07-31 | Disposition: A | Discharge status: Home / Self Care | Payer: Self-pay | Attending: Physician Assistant | Admitting: Physician Assistant

## 2019-07-31 ENCOUNTER — Inpatient Hospital Stay: Payer: Self-pay | Admitting: Anesthesiology

## 2019-07-31 ENCOUNTER — Inpatient Hospital Stay: Payer: Self-pay

## 2019-07-31 DIAGNOSIS — R918 Other nonspecific abnormal finding of lung field: Secondary | ICD-10-CM

## 2019-07-31 DIAGNOSIS — R7881 Bacteremia: Secondary | ICD-10-CM

## 2019-07-31 HISTORY — PX: TEE WITHOUT CARDIOVERSION: SHX5443

## 2019-07-31 LAB — PROCALCITONIN: Procalcitonin: <0.1 ng/mL

## 2019-07-31 LAB — CREATININE, SERUM
Creatinine, Ser: 0.61 mg/dL (ref 0.44–1.00)
GFR calc Af Amer: >60 mL/min (ref >60)
GFR calc non Af Amer: >60 mL/min (ref >60)

## 2019-07-31 SURGERY — ECHOCARDIOGRAM, TRANSESOPHAGEAL
Anesthesia: General

## 2019-07-31 SURGERY — ECHOCARDIOGRAM, TRANSESOPHAGEAL
Anesthesia: Moderate Sedation

## 2019-07-31 MED ORDER — PROPOFOL 10 MG/ML IV BOLUS
INTRAVENOUS | Status: DC | PRN
  Administered 2019-07-31: 50 mg via INTRAVENOUS
  Administered 2019-07-31 (×2): 20 mg via INTRAVENOUS
  Administered 2019-07-31: 50 mg via INTRAVENOUS

## 2019-07-31 MED ORDER — SODIUM CHLORIDE 0.9 % IV SOLN
INTRAVENOUS | Status: DC
  Administered 2019-07-31: 07:00:00 via INTRAVENOUS

## 2019-07-31 MED ORDER — PROPOFOL 500 MG/50ML IV EMUL
INTRAVENOUS | Status: AC
  Filled 2019-07-31: qty 50

## 2019-07-31 MED ORDER — BUTAMBEN-TETRACAINE-BENZOCAINE 2-2-14 % EX AERO
INHALATION_SPRAY | CUTANEOUS | Status: AC
  Filled 2019-07-31: qty 5

## 2019-07-31 MED ORDER — SODIUM CHLORIDE FLUSH 0.9 % IV SOLN
INTRAVENOUS | Status: AC
  Filled 2019-07-31: qty 10

## 2019-07-31 MED ORDER — HYDROMORPHONE HCL 1 MG/ML IJ SOLN
0.5000 mg | Freq: Two times a day (BID) | INTRAMUSCULAR | Status: DC | PRN
  Administered 2019-07-31 – 2019-08-02 (×4): 0.5 mg via INTRAVENOUS
  Filled 2019-07-31 (×4): qty 1

## 2019-07-31 MED ORDER — LIDOCAINE VISCOUS HCL 2 % MT SOLN
OROMUCOSAL | Status: AC
  Filled 2019-07-31: qty 15

## 2019-07-31 NOTE — Progress Notes (Signed)
[place] at [facility]   PATIENT NAME: Eileen [name]    MR#:  [id number]  DATE OF BIRTH:  [birth year]  SUBJECTIVE:  tolerating oral diet well No fever Pt in great spirits!! Her TEE was negative REVIEW OF SYSTEMS:  Review of Systems  Constitutional: Negative for diaphoresis, fever, malaise/fatigue and weight loss.  HENT: Negative for ear discharge, ear pain, hearing loss, nosebleeds, sore throat and tinnitus.   Eyes: Negative for blurred vision and pain.  Respiratory: Negative for cough, hemoptysis, shortness of breath and wheezing.   Cardiovascular: Negative for chest pain, palpitations, orthopnea and leg swelling.  Gastrointestinal: Negative for abdominal pain, blood in stool, constipation, diarrhea, heartburn, nausea and vomiting.  Genitourinary: Negative for dysuria, frequency and urgency.  Musculoskeletal: Positive for joint pain and myalgias. Negative for back pain.  Skin: Negative for itching and rash.  Neurological: Positive for weakness. Negative for dizziness, tingling, tremors, focal weakness, seizures and headaches.  Psychiatric/Behavioral: Negative for depression. The patient is not nervous/anxious.    DRUG ALLERGIES:   Allergies  Allergen Reactions  . Sulfa Antibiotics Shortness Of Breath   VITALS:  Blood pressure 106/79, pulse 93, temperature 98.6 F (37 C), temperature source Oral, resp. rate 17, height 5\' 4"  (1.626 m), weight 54.4 kg, last menstrual period 02/16/2019, SpO2 98 %. PHYSICAL EXAMINATION:  Physical Exam HENT:     Head: Normocephalic and atraumatic.  Eyes:     Conjunctiva/sclera: Conjunctivae normal.     Pupils: Pupils are equal, round, and reactive to light.  Neck:     Musculoskeletal: Normal range of motion and neck supple.     Thyroid: No thyromegaly.     Trachea: No tracheal deviation.  Cardiovascular:     Rate and Rhythm: Normal rate and regular rhythm.     Heart sounds: Normal heart sounds.  Pulmonary:     Effort:  Pulmonary effort is normal. No respiratory distress.     Breath sounds: Normal breath sounds. No wheezing.  Chest:     Chest wall: No tenderness.  Abdominal:     General: Bowel sounds are normal. There is no distension.     Palpations: Abdomen is soft.     Tenderness: There is no abdominal tenderness.  Musculoskeletal: Normal range of motion.  Skin:    General: Skin is warm and dry.     Findings: No rash.     Comments: Rt side of the face much improved  Neurological:     Mental Status: She is alert and oriented to person, place, and time.     Cranial Nerves: No cranial nerve deficit.    Day 1   Showing improvement  On 07/29/2019    LABORATORY PANEL:  Female CBC Recent Labs  Lab 07/25/19 0526  WBC 15.2*  HGB 8.9*  HCT 27.9*  PLT 473*   ------------------------------------------------------------------------------------------------------------------ Chemistries  Recent Labs  Lab 07/29/19 0430 07/31/19 0429  NA 137  --   K 4.3  --   CL 99  --   CO2 29  --   GLUCOSE 107*  --   BUN 12  --   CREATININE 0.69 0.61  CALCIUM 8.9  --    RADIOLOGY:  No results found. ASSESSMENT AND PLAN:  Eileen [name]  is a 38 y.o. female with a known history of IV drug abuse and polysubstance abuse admitted with complaints of left-sided chest pain that hurts when she breathes. She also has right facial cellulitis worsening for past two days.  1. MRSA Sepsis  secondary to multifocal pneumonia and right facial swelling/cellulitis in the setting of IV drug abuse (heroin) - continue IV vancomycin for 6 weeks and  Linezolid will be d/ced once vanc levels are therapuetic - appreciate ID input - 2D echo not showing any vegetation.  -TEE on 07/31/19--NO evidence of endocarditis -Repeat CT maxillofacial on 11/2 shows improvement -Continue PRN pain meds -ID recommends  2 weeks IV vancomycin (last dose 08/02/19--with 1 week po abx--follow up ID input -ENT Dr Vaught's input appreciated--  patient is refusing to get I & D done--much improved swelling -Repeat CT 11/2 shows some improvement.  -IV Dilaudid and oral Percocet prn  2. Multifocal pneumonia secondary to IV drug abuse -oxygen saturations normal -continue above antibiotics  3. Leukocytosis secondary to infection -improving  4. Hypokalemia -repleted and resolved  5. Heroin use/anxiety-  psych input appreciated -Continue gabapentin and hydroxyzine prn -decreased dose of gabapentin to 300 mg bid --dr Caroline More informed.  6. 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  CODE STATUS: Full Code  TOTAL TIME TAKING CARE OF THIS PATIENT: 25 minutes.   More than 50% of the time was spent in counseling/coordination of care: YES  Possible d/c 08/02/2019  Fritzi Mandes M.D on 07/31/2019 at 10:50 AM  Between 7am to 6pm - Pager - [phone]  After 6pm go to [email or web address] - password [email or web address]  [facility]   CC: Primary care physician; Patient, No Pcp Per  Note: This dictation was prepared with Dragon dictation along with smaller phrase technology. Any transcriptional errors that result from this process are unintentional.

## 2019-07-31 NOTE — Anesthesia Post-op Follow-up Note (Signed)
Anesthesia QCDR form completed.        

## 2019-07-31 NOTE — Progress Notes (Signed)
   Date of Admission:  07/19/2019         Subjective: Feeling better Had TEE and neg for endocarditis  Medications:  . butamben-tetracaine-benzocaine      . chlorhexidine  15 mL Mouth Rinse BID  . clonazePAM  0.5 mg Oral BID  . enoxaparin (LOVENOX) injection  40 mg Subcutaneous Daily  . lidocaine      . mouth rinse  15 mL Mouth Rinse q12n4p  . multivitamin with minerals  1 tablet Oral Daily  . nicotine  14 mg Transdermal Daily  . sodium chloride flush        Objective: Vital signs in last 24 hours: Temp:  [98.2 F (36.8 C)-98.6 F (37 C)] 98.5 F (36.9 C) (11/10 1555) Pulse Rate:  [93-110] 107 (11/10 1555) Resp:  [13-24] 16 (11/10 1555) BP: (99-134)/(68-89) 112/72 (11/10 1555) SpO2:  [92 %-100 %] 98 % (11/10 1555)  PHYSICAL EXAM:  General: Alert, cooperative, no distress, appears stated age.  Rt side of the face - swelling much better Neurologic: Grossly non-focal  Lab Results Recent Labs    07/29/19 0430 07/31/19 0429  NA 137  --   K 4.3  --   CL 99  --   CO2 29  --   BUN 12  --   CREATININE 0.69 0.61   Microbiology: 07/19/2019 blood culture MRSA 07/21/2019 blood culture no growth 07/20/2019 wound culture MRSA   Assessment/Plan:  MRSA bacteremia likely from the face wound , cellulitis- TEE neg for endocarditis  Multifocal pneumonia- repeat CXR to look for resolution of pneumonia There was concern for septic emboli as CTA showed multiple nodules- will repeat CXR to see whether they are improving. Will decide on route and duration after that.  Rt facial cellulitis and MRSA wound/evolving abscess.  much improved

## 2019-07-31 NOTE — Anesthesia Postprocedure Evaluation (Signed)
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  Procedure(s) Performed: TRANSESOPHAGEAL ECHOCARDIOGRAM (TEE) (N/A )  Patient location during evaluation: PACU Anesthesia Type: General Level of consciousness: awake and alert Pain management: pain level controlled Vital Signs Assessment: post-procedure vital signs reviewed and stable Respiratory status: spontaneous breathing, nonlabored ventilation and respiratory function stable Cardiovascular status: blood pressure returned to baseline and stable Postop Assessment: no apparent nausea or vomiting Anesthetic complications: no     Last Vitals:  Vitals:   07/31/19 0815 07/31/19 0831  BP: 111/69 106/79  Pulse: 99 93  Resp: 13 17  Temp:  37 C  SpO2: 97% 98%    Last Pain:  Vitals:   07/31/19 0831  TempSrc: Oral  PainSc:                  Durenda Hurt

## 2019-07-31 NOTE — Progress Notes (Signed)
Nutrition Follow-up  DOCUMENTATION CODES:   Not applicable  INTERVENTION:  Encouraged adequate intake of calories and protein at meals. Patient continues to not want any oral nutrition supplements.  Continue daily MVI.  NUTRITION DIAGNOSIS:   Inadequate oral intake related to decreased appetite as evidenced by per patient/family report.  Resolving - PO intake improved.  GOAL:   Patient will meet greater than or equal to 90% of their needs  Progressing.  MONITOR:   PO intake, Supplement acceptance, Labs, Weight trends, Skin, I & O's  REASON FOR ASSESSMENT:   Malnutrition Screening Tool    ASSESSMENT:   38 year old female with PMHx of anxiety, polysubstance abuse admitted with MRSA sepsis, multifocal PNA, right facial swelling/cellulitis in the setting of IV drug abuse (heroin).  Patient reports her appetite continues to be good. She reports she is eating 100% of her meals (po intake not being recorded in chart). She continues to not want any oral nutrition supplements.  Medications reviewed and include: MVI daily, nicotine patch, vancomycin.  Labs reviewed.  Diet Order:   Diet Order            Diet regular Room service appropriate? Yes; Fluid consistency: Thin  Diet effective now             EDUCATION NEEDS:   No education needs have been identified at this time  Skin:  Skin Assessment: Reviewed RN Assessment  Last BM:  07/27/2019 per chart  Height:   Ht Readings from Last 1 Encounters:  07/23/19 5\' 4"  (1.626 m)   Weight:   Wt Readings from Last 1 Encounters:  07/23/19 54.4 kg   Ideal Body Weight:  54.5 kg  BMI:  Body mass index is 20.59 kg/m.  Estimated Nutritional Needs:   Kcal:  1500-1700  Protein:  75-85 grams  Fluid:  1.5-1.7 L/day  Jacklynn Barnacle, MS, RD, LDN Office: [phone] Pager: [phone] After Hours/Weekend Pager: [phone]

## 2019-07-31 NOTE — Transfer of Care (Signed)
Immediate Anesthesia Transfer of Care Note  Patient: Eileen [name]  Procedure(s) Performed: TRANSESOPHAGEAL ECHOCARDIOGRAM (TEE) (N/A )  Patient Location: PACU and Cath Lab  Anesthesia Type:General  Level of Consciousness: awake, drowsy and patient cooperative  Airway & Oxygen Therapy: Patient Spontanous Breathing and Patient connected to nasal cannula oxygen  Post-op Assessment: Report given to RN and Post -op Vital signs reviewed and stable  Post vital signs: Reviewed and stable  Last Vitals:  Vitals Value Taken Time  BP 107/68 07/31/19 0743  Temp    Pulse 106 07/31/19 0746  Resp 19 07/31/19 0746  SpO2 98 % 07/31/19 0746  Vitals shown include unvalidated device data.  Last Pain:  Vitals:   07/31/19 0704  TempSrc: Oral  PainSc: Asleep      Patients Stated Pain Goal: 0 (05/04/47 1856)  Complications: No apparent anesthesia complications

## 2019-07-31 NOTE — Anesthesia Preprocedure Evaluation (Addendum)
Anesthesia Evaluation  Patient identified by MRN, date of birth, ID band Patient awake    Reviewed: Allergy & Precautions, H&P , NPO status , Patient's Chart, lab work & pertinent test results  Airway Mallampati: III  TM Distance: >3 FB Neck ROM: full    Dental  (+) Chipped   Pulmonary pneumonia (multifocal PNA), Current Smoker,           Cardiovascular negative cardio ROS       Neuro/Psych PSYCHIATRIC DISORDERS Anxiety negative neurological ROS     GI/Hepatic negative GI ROS, (+)     substance abuse  IV drug use,   Endo/Other  negative endocrine ROS  Renal/GU negative Renal ROS  negative genitourinary   Musculoskeletal  (+) narcotic dependent  Abdominal   Peds  Hematology negative hematology ROS (+)   Anesthesia Other Findings Concern for vegetation on tricuspid valve due to multifocal PNA and h/o IVDU  Past Medical History: No date: Anxiety No date: IVDU (intravenous drug user) No date: Polysubstance abuse [facility])  Past Surgical History: 07/23/2019: TEE WITHOUT CARDIOVERSION; N/A     Comment:  Procedure: TRANSESOPHAGEAL ECHOCARDIOGRAM (TEE);                Surgeon: Wellington Hampshire, MD;  Location: [facility] ORS;                Service: Cardiovascular;  Laterality: N/A;  BMI    Body Mass Index: 20.59 kg/m      Reproductive/Obstetrics negative OB ROS                            Anesthesia Physical Anesthesia Plan  ASA: III  Anesthesia Plan: General   Post-op Pain Management:    Induction:   PONV Risk Score and Plan:   Airway Management Planned: Natural Airway and Nasal Cannula  Additional Equipment:   Intra-op Plan:   Post-operative Plan:   Informed Consent: I have reviewed the patients History and Physical, chart, labs and discussed the procedure including the risks, benefits and alternatives for the proposed anesthesia with the patient or authorized representative  who has indicated his/her understanding and acceptance.     Dental Advisory Given  Plan Discussed with: Anesthesiologist, CRNA and Surgeon  Anesthesia Plan Comments:        Anesthesia Quick Evaluation

## 2019-07-31 NOTE — Interval H&P Note (Signed)
History and Physical Interval Note:  07/31/2019 7:23 AM  Eileen [name]  has presented today for surgery, with the diagnosis of bacteremia.  The various methods of treatment have been discussed with the patient and family. After consideration of risks, benefits and other options for treatment, the patient has consented to  Procedure(s): TRANSESOPHAGEAL ECHOCARDIOGRAM (TEE) (N/A) as a surgical intervention.  The patient's history has been reviewed, patient examined, no change in status, stable for surgery.  I have reviewed the patient's chart and labs.  Questions were answered to the patient's satisfaction.     Eileen [name]

## 2019-07-31 NOTE — Progress Notes (Signed)
*  PRELIMINARY RESULTS* Echocardiogram Echocardiogram Transesophageal has been performed.  Sherrie Sport 07/31/2019, 7:51 AM

## 2019-07-31 NOTE — CV Procedure (Signed)
    Transesophageal Echocardiogram Note  Eileen [name] [id number] 19-Sep-1981  Procedure: Transesophageal Echocardiogram Indications: MRSA bacteremia  Procedure Details Consent: Obtained Time Out: Verified patient identification, verified procedure, site/side was marked, verified correct patient position, special equipment/implants available, Radiology Safety Procedures followed,  medications/allergies/relevent history reviewed, required imaging and test results available.  Performed  Medications:  The patient was sedated with propofol by anesthesia.  Left Ventrical:  Normal  Mitral Valve: Normal.  Trivial MR.  No vegetation.  Aortic Valve: Trileaflet.  Normal.  No vegetation.  Tricuspid Valve: Normal.  Trivial TR.  No vegetation.  Pulmonic Valve: Normal.  Trivial PR.  Left Atrium/ Left atrial appendage: Normal.  No thrombus.  Atrial septum: No shunt by color Doppler.  Aorta: Normal.  Complications: No apparent complications Patient did tolerate procedure well.  Conclusions: No evidence of endocarditis.  Nelva Bush, MD 07/31/2019, 7:50 AM

## 2019-08-01 ENCOUNTER — Encounter: Payer: Self-pay | Admitting: Internal Medicine

## 2019-08-01 DIAGNOSIS — I2690 Septic pulmonary embolism without acute cor pulmonale: Secondary | ICD-10-CM

## 2019-08-01 LAB — CBC WITH DIFFERENTIAL/PLATELET
Abs Immature Granulocytes: 0.13 10*3/uL — ABNORMAL HIGH (ref 0.00–0.07)
Basophils Absolute: 0.1 10*3/uL (ref 0.0–0.1)
Basophils Relative: 1 %
Eosinophils Absolute: 0.6 10*3/uL — ABNORMAL HIGH (ref 0.0–0.5)
Eosinophils Relative: 4 %
HCT: 29 % — ABNORMAL LOW (ref 36.0–46.0)
Hemoglobin: 9.2 g/dL — ABNORMAL LOW (ref 12.0–15.0)
Immature Granulocytes: 1 %
Lymphocytes Relative: 37 %
Lymphs Abs: 4.7 10*3/uL — ABNORMAL HIGH (ref 0.7–4.0)
MCH: 25.9 pg — ABNORMAL LOW (ref 26.0–34.0)
MCHC: 31.7 g/dL (ref 30.0–36.0)
MCV: 81.7 fL (ref 80.0–100.0)
Monocytes Absolute: 1.2 10*3/uL — ABNORMAL HIGH (ref 0.1–1.0)
Monocytes Relative: 10 %
Neutro Abs: 6 10*3/uL (ref 1.7–7.7)
Neutrophils Relative %: 47 %
Platelets: 682 10*3/uL — ABNORMAL HIGH (ref 150–400)
RBC: 3.55 MIL/uL — ABNORMAL LOW (ref 3.87–5.11)
RDW: 16.3 % — ABNORMAL HIGH (ref 11.5–15.5)
Smear Review: NORMAL
WBC: 12.7 10*3/uL — ABNORMAL HIGH (ref 4.0–10.5)
nRBC: 0 % (ref 0.0–0.2)

## 2019-08-01 NOTE — Progress Notes (Signed)
   Date of Admission:  07/19/2019         Subjective: sleeping  Medications:  . chlorhexidine  15 mL Mouth Rinse BID  . clonazePAM  0.5 mg Oral BID  . enoxaparin (LOVENOX) injection  40 mg Subcutaneous Daily  . mouth rinse  15 mL Mouth Rinse q12n4p  . multivitamin with minerals  1 tablet Oral Daily  . nicotine  14 mg Transdermal Daily    Objective: Vital signs in last 24 hours: Temp:  [98 F (36.7 C)-98.7 F (37.1 C)] 98.7 F (37.1 C) (11/11 1030) Pulse Rate:  [95-107] 95 (11/11 1030) Resp:  [16-17] 17 (11/11 1030) BP: (100-123)/(60-85) 100/60 (11/11 1030) SpO2:  [92 %-98 %] 96 % (11/11 1030)  PHYSICAL EXAM:  Lab Results Recent Labs    07/31/19 0429 08/01/19 0355  WBC  --  12.7*  HGB  --  9.2*  HCT  --  29.0*  CREATININE 0.61  --   CXR-improving multifocal infiltrates Microbiology: 10/29 [facility] -MRSA 10/31 [facility]-NG Studies/Results: Dg Chest 2 View  Result Date: 07/31/2019 CLINICAL DATA:  MRSA bacteremia. Polysubstance abuse. EXAM: CHEST - 2 VIEW COMPARISON:  Chest x-ray dated 07/19/2019 FINDINGS: There are improving multifocal airspace opacities bilaterally consistent with the patient's history of septic emboli. There is no pneumothorax. There are trace bilateral pleural effusions. The heart size is relatively normal. There is no acute osseous abnormality. IMPRESSION: Improving multifocal airspace opacities. Persistent trace bilateral pleural effusions. Electronically Signed   By: Constance Holster M.D.   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 Discussed with CM and [facility] pharmacist

## 2019-08-01 NOTE — Progress Notes (Signed)
PROGRESS NOTE  Eileen [name] GMW:[id number] DOB: [birth year] DOA: 07/19/2019 PCP: Patient, No Pcp Per   LOS: 13 days   Brief Narrative / Interim history: 38 year old female with history of IV drug abuse, polysubstance abuse, came into the hospital and was admitted on 07/19/2019 with left-sided chest pain and pain with deep breathing, along with right facial cellulitis that was worsening 2 days prior to admission.  She was found to have MRSA bacteremia, ID was consulted.  Subjective / 24h Interval events: She is doing much better this morning, denies any shortness of breath, denies any chest pain.  She is quite anxious about going home tomorrow.  Assessment & Plan: Principal Problem:   MRSA bacteremia Active Problems:   Polysubstance dependence including opioid drug with daily use ([place])   Sepsis ([place])   IV drug user   Facial cellulitis   Septic pulmonary embolism ([facility])   Anxiety   Lobar pneumonia ([place])   Septic embolism ([facility])   Principal Problem Sepsis due to MRSA bacteremia with secondary multifocal pneumonia and right facial cellulitis in the setting of IV drug use -Infectious disease consulted, recommendations are for IV vancomycin for 2 weeks (since TEE was negative) followed by 2 weeks of linezolid p.o. -She is to finish her vancomycin on 08/02/2019, care management consulted to assist with linezolid as an outpatient.  Alternatively, she may need to spend 2 additional weeks with IV vancomycin in the hospital if linezolid cannot be arranged -2D echo did not show any vegetation, underwent TEE on 11/10 without evidence of endocarditis -CT maxillofacial 11/2 showed improvement and clinically her right facial cellulitis is improving -Continue pain regimen, seems to be under control -Chest x-ray done this morning shows improvement in her multifocal pneumonia, she is stable on room air  Active Problems Heroin use / anxiety -psych input appreciated, continue medications as below   Hep C positive -RNA negative  Scheduled Meds: . chlorhexidine  15 mL Mouth Rinse BID  . clonazePAM  0.5 mg Oral BID  . enoxaparin (LOVENOX) injection  40 mg Subcutaneous Daily  . mouth rinse  15 mL Mouth Rinse q12n4p  . multivitamin with minerals  1 tablet Oral Daily  . nicotine  14 mg Transdermal Daily   Continuous Infusions: . sodium chloride Stopped (07/31/19 2123)  . vancomycin Stopped (07/31/19 2223)   PRN Meds:.sodium chloride, acetaminophen **OR** acetaminophen, HYDROmorphone (DILAUDID) injection, hydrOXYzine, ondansetron **OR** ondansetron (ZOFRAN) IV, oxyCODONE-acetaminophen, sodium chloride flush  DVT prophylaxis: Lovenox  Code Status: Full code Family Communication: d/w patient  Disposition Plan: home when ready  Consultants:  Infectious disease Psychiatry  Procedures:  2D echo:  IMPRESSIONS  1. Left ventricular ejection fraction, by visual estimation, is 60 to 65%. The left ventricle has normal function. There is no left ventricular hypertrophy.  2. Global right ventricle has normal systolic function.The right ventricular size is normal. No increase in right ventricular wall thickness.  3. Left atrial size was normal.  4. Mild mitral valve regurgitation.  5. Tricuspid valve regurgitation is mild.  6. Normal pulmonary artery systolic pressure.  TEE IMPRESSIONS  1. No evidence of vegetation.  2. Left ventricular ejection fraction, by visual estimation, is 60 to 65%. The left ventricle has normal function. There is no left ventricular hypertrophy.  3. Global right ventricle has normal systolic function.The right ventricular size is normal. Right vetricular wall thickness was not assessed.  4. Left atrial size was not assessed.  5. Right atrial size was not assessed.  6. The mitral valve is  normal in structure. Trace mitral valve regurgitation.  7. No trisuspid valve vegetation visualized.  8. The tricuspid valve is normal in structure. Tricuspid valve  regurgitation is trivial.  9. The aortic valve is tricuspid. Aortic valve regurgitation is not visualized. 10. No pulmonic vegetation present. 11. The pulmonic valve was grossly normal. Pulmonic valve regurgitation is trivial.  Microbiology  Blood cultures 07/21/2019-no growth Aerobic culture, face-MRSA sensitive to tetracycline and clindamycin Blood cultures 07/19/2019-MRSA Urine culture-no significant growth  Antimicrobials: IV vancomycin 07/19/2019   Objective: Vitals:   07/31/19 0831 07/31/19 1555 07/31/19 2020 08/01/19 0514  BP: 106/79 112/72 123/76 108/85  Pulse: 93 (!) 107 (!) 105 99  Resp: 17 16  17   Temp: 98.6 F (37 C) 98.5 F (36.9 C) 98 F (36.7 C) 98.4 F (36.9 C)  TempSrc: Oral Oral Oral Oral  SpO2: 98% 98% 92% 96%  Weight:      Height:        Intake/Output Summary (Last 24 hours) at 08/01/2019 1016 Last data filed at 08/01/2019 0200 Gross per 24 hour  Intake 639.55 ml  Output -  Net 639.55 ml   Filed Weights   07/19/19 1452 07/23/19 1200  Weight: 54.4 kg 54.4 kg    Examination:  Constitutional: NAD Eyes: lids and conjunctivae normal, no scleral icterus ENMT: Mucous membranes are moist.  Neck: normal, supple Respiratory: clear to auscultation bilaterally, no wheezing, no crackles. Normal respiratory effort.  Cardiovascular: Regular rate and rhythm, no murmurs / rubs / gallops. No LE edema Abdomen: no tenderness. Bowel sounds positive.  Musculoskeletal: no clubbing / cyanosis.  Skin: no rashes Neurologic: CN 2-12 grossly intact. Strength 5/5 in all 4.  Psychiatric: Normal judgment and insight. Alert and oriented x 3. Normal mood.    Data Reviewed: I have independently reviewed following labs and imaging studies   CBC: Recent Labs  Lab 08/01/19 0355  WBC 12.7*  NEUTROABS 6.0  HGB 9.2*  HCT 29.0*  MCV 81.7  PLT 682*   Basic Metabolic Panel: Recent Labs  Lab 07/26/19 0503 07/27/19 0742 07/29/19 0430 07/31/19 0429  NA  --  137  137  --   K  --  3.9 4.3  --   CL  --  103 99  --   CO2  --  26 29  --   GLUCOSE  --  95 107*  --   BUN  --  11 12  --   CREATININE 0.75 0.71 0.69 0.61  CALCIUM  --  8.0* 8.9  --    GFR: Estimated Creatinine Clearance: 81.1 mL/min (by C-G formula based on SCr of 0.61 mg/dL). Liver Function Tests: No results for input(s): AST, ALT, ALKPHOS, BILITOT, PROT, ALBUMIN in the last 168 hours. Coagulation Profile: No results for input(s): INR, PROTIME in the last 168 hours. HbA1C: No results for input(s): HGBA1C in the last 72 hours. CBG: No results for input(s): GLUCAP in the last 168 hours.  No results found for this or any previous visit (from the past 240 hour(s)).   Radiology Studies: Dg Chest 2 View  Result Date: 07/31/2019 CLINICAL DATA:  MRSA bacteremia. Polysubstance abuse. EXAM: CHEST - 2 VIEW COMPARISON:  Chest x-ray dated 07/19/2019 FINDINGS: There are improving multifocal airspace opacities bilaterally consistent with the patient's history of septic emboli. There is no pneumothorax. There are trace bilateral pleural effusions. The heart size is relatively normal. There is no acute osseous abnormality. IMPRESSION: Improving multifocal airspace opacities. Persistent trace bilateral pleural effusions. Electronically Signed  By: Katherine Mantlehristopher  Green M.D.   On: 07/31/2019 18:35    Pamella Pertostin Kyrin Garn, MD, PhD [facility]  Contact via  [email or web address]

## 2019-08-01 NOTE — Progress Notes (Signed)
Pharmacy Antibiotic Note  Eileen [name] is a 38 y.o. female admitted on 07/19/2019 with pneumonia/facial cellulitis/ MRSA bacteremia. Concern for endocarditis, however patient refused TEE s/t pain and inability to open mouth. With multifocal PNA, question septic emboli. Pharmacy has been consulted for Vancomycin dosing.   Patient reportedly in [place] recently for MRSA bacteremia but left AMA.  Patient was on linezolid to bridge therapy until vancomycin therapeutic. Linezolid discontinued 11/5.  Plan: Continue Vancomycin 1000 mg IV Q12H. Calculated AUC 512.9 previously.  Renal function has been stable. Will order SCr for AM to continue to monitor renal function.  Per notes, may change to PO abx tomorrow (11/12) so will hold off on levels until plan known. If vancomycin is continued, will need levels.    Height: 5\' 4"  (162.6 cm) Weight: 119 lb 14.9 oz (54.4 kg) IBW/kg (Calculated) : 54.7  Temp (24hrs), Avg:98.3 F (36.8 C), Min:98 F (36.7 C), Max:98.5 F (36.9 C)  Recent Labs  Lab 07/25/19 1743 07/26/19 0316 07/26/19 0503 07/27/19 0742 07/28/19 0656 07/28/19 1533 07/29/19 0430 07/31/19 0429 08/01/19 0355  WBC  --   --   --   --   --   --   --   --  12.7*  CREATININE  --   --  0.75 0.71  --   --  0.69 0.61  --   VANCOTROUGH  --  11*  --   --   --  14*  --   --   --   VANCOPEAK 26*  --   --   --  43*  --   --   --   --     Estimated Creatinine Clearance: 81.1 mL/min (by C-G formula based on SCr of 0.61 mg/dL).    Allergies  Allergen Reactions  . Sulfa Antibiotics Shortness Of Breath    Antimicrobials this admission: Cefepime 10/29 >>  10/30 Metro 10/29 >>  10/30 Linezolid 10/30 >> 11/5 Vanc 10/29 >>  Dose adjustments this admission: 11/1 vanc 750 mg q12h to 1000 mg q12h 11/5 vanc 1000 mg q12h to 1250 mg q12h 11/7 vanc 1250 mg Q12H to 500 mg Q8H 11/8 vancomycin 500 mg q8H to vancomycin 1000 mg q12H.   Microbiology results: 10/29 BCx: Staphylococcus aureus -  MRSA-4of4 10/29 UCx: insignif 10/29 MRSA PCR: + 10/30 Facial culture: MRSA 10/31 BCx x2 NG  Thank you for allowing pharmacy to be a part of this patient's care.  Rocky Morel, PharmD, BCPS 08/01/2019 8:32 AM

## 2019-08-02 LAB — CREATININE, SERUM
Creatinine, Ser: 0.78 mg/dL (ref 0.44–1.00)
GFR calc Af Amer: >60 mL/min (ref >60)
GFR calc non Af Amer: >60 mL/min (ref >60)

## 2019-08-02 MED ORDER — CLONAZEPAM 0.5 MG PO TABS: 0.5000 mg | ORAL_TABLET | Freq: Two times a day (BID) | ORAL | 0 refills | Status: AC

## 2019-08-02 MED ORDER — OXYCODONE-ACETAMINOPHEN 5-325 MG PO TABS: 1.0000 | ORAL_TABLET | Freq: Four times a day (QID) | ORAL | 0 refills | Status: AC | PRN

## 2019-08-02 MED ORDER — LINEZOLID 600 MG PO TABS: 600.0000 mg | ORAL_TABLET | Freq: Two times a day (BID) | ORAL | 0 refills | Status: AC

## 2019-08-02 NOTE — TOC Progression Note (Signed)
Transition of Care [facility]) - Progression Note    Patient Details  Name: Eileen [name] MRN: [id number] Date of Birth: 10-Mar-1981  Transition of Care [facility]) CM/SW Contact  Tabita Corbo, Lenice Llamas Phone Number: [phone]  08/02/2019, 2:57 PM  Clinical Narrative: Per pharmacist patient's medication linezolid has been called in at [facility] on [address] and should be $3. Holiday representative ([place]) attempted to meet with patient however she had already discharged home. Per RN patient was aware that her linezolid was at [facility] on [address]. RN gave [facility] information. CSW attempted to contact patient via telephone however she did not answer and a voicemail was left. Please reconsult if future social work needs arise. CSW signing off.            Expected Discharge Plan and Services           Expected Discharge Date: 08/02/19                                     Social Determinants of Health (SDOH) Interventions    Readmission Risk Interventions No flowsheet data found.

## 2019-08-02 NOTE — Discharge Instructions (Signed)
Please go to   [facility] pharmacy on [address], [place], [place] [address]  To pick up your linezolid (Zyvox), take as instructed for 2 more weeks.  Follow with PCP in 2 weeks  Please get a complete blood count and chemistry panel checked by your Primary MD at your next visit, and again as instructed by your Primary MD. Please get your medications reviewed and adjusted by your Primary MD.  Please request your Primary MD to go over all Hospital Tests and Procedure/Radiological results at the follow up, please get all Hospital records sent to your Prim MD by signing hospital release before you go home.  In some cases, there will be blood work, cultures and biopsy results pending at the time of your discharge. Please request that your primary care M.D. goes through all the records of your hospital data and follows up on these results.  If you had Pneumonia of Lung problems at the Hospital: Please get a 2 view Chest X ray done in 6-8 weeks after hospital discharge or sooner if instructed by your Primary MD.  If you have Congestive Heart Failure: Please call your Cardiologist or Primary MD anytime you have any of the following symptoms:  1) 3 pound weight gain in 24 hours or 5 pounds in 1 week  2) shortness of breath, with or without a dry hacking cough  3) swelling in the hands, feet or stomach  4) if you have to sleep on extra pillows at night in order to breathe  Follow cardiac low salt diet and 1.5 lit/day fluid restriction.  If you have diabetes Accuchecks 4 times/day, Once in AM empty stomach and then before each meal. Log in all results and show them to your primary doctor at your next visit. If any glucose reading is under 80 or above 300 call your primary MD immediately.  If you have Seizure/Convulsions/Epilepsy: Please do not drive, operate heavy machinery, participate in activities at heights or participate in high speed sports until you have seen by Primary MD or a Neurologist  and advised to do so again. Per [facility] statutes, patients with seizures are not allowed to drive until they have been seizure-free for six months.  Use caution when using heavy equipment or power tools. Avoid working on ladders or at heights. Take showers instead of baths. Ensure the water temperature is not too high on the home water heater. Do not go swimming alone. Do not lock yourself in a room alone (i.e. bathroom). When caring for infants or small children, sit down when holding, feeding, or changing them to minimize risk of injury to the child in the event you have a seizure. Maintain good sleep hygiene. Avoid alcohol.   If you had Gastrointestinal Bleeding: Please ask your Primary MD to check a complete blood count within one week of discharge or at your next visit. Your endoscopic/colonoscopic biopsies that are pending at the time of discharge, will also need to followed by your Primary MD.  Get Medicines reviewed and adjusted. Please take all your medications with you for your next visit with your Primary MD  Please request your Primary MD to go over all hospital tests and procedure/radiological results at the follow up, please ask your Primary MD to get all Hospital records sent to his/her office.  If you experience worsening of your admission symptoms, develop shortness of breath, life threatening emergency, suicidal or homicidal thoughts you must seek medical attention immediately by calling 911 or calling your  MD immediately  if symptoms less severe.  You must read complete instructions/literature along with all the possible adverse reactions/side effects for all the Medicines you take and that have been prescribed to you. Take any new Medicines after you have completely understood and accpet all the possible adverse reactions/side effects.   Do not drive or operate heavy machinery when taking Pain medications.   Do not take more than prescribed Pain, Sleep and Anxiety  Medications  Special Instructions: If you have smoked or chewed Tobacco  in the last 2 yrs please stop smoking, stop any regular Alcohol  and or any Recreational drug use.  Wear Seat belts while driving.  Please note You were cared for by a hospitalist during your hospital stay. If you have any questions about your discharge medications or the care you received while you were in the hospital after you are discharged, you can call the unit and asked to speak with the hospitalist on call if the hospitalist that took care of you is not available. Once you are discharged, your primary care physician will handle any further medical issues. Please note that NO REFILLS for any discharge medications will be authorized once you are discharged, as it is imperative that you return to your primary care physician (or establish a relationship with a primary care physician if you do not have one) for your aftercare needs so that they can reassess your need for medications and monitor your lab values.  You can reach the hospitalist office at phone [phone] or fax [phone]   If you do not have a primary care physician, you can call [phone] for a physician referral.  Activity: As tolerated with Full fall precautions use walker/cane & assistance as needed    Diet: regular  Disposition Home

## 2019-08-02 NOTE — Progress Notes (Signed)
Discharge instructions given and went over with patient at bedside. All questions answered. Patient discharged home. Gayanne Prescott S, RN  

## 2019-08-02 NOTE — Discharge Summary (Signed)
Physician Discharge Summary  Eileen [name] FAO:[id number] DOB: 08-31-1981 DOA: 07/19/2019  PCP: Patient, No Pcp Per  Admit date: 07/19/2019 Discharge date: 08/02/2019  Admitted From: home Disposition:  home  Recommendations for Outpatient Follow-up:  1. Follow up with PCP in 1-2 weeks 2. Continue Linezolid for 2 weeks  Home Health: none Equipment/Devices: none  Discharge Condition: stable CODE STATUS: Full code Diet recommendation: regular  HPI: Per admitting MD, [name]  is a 38 y.o. female with a known history of IV drug abuse and polysubstance abuse comes to the emergency room with complaints of left-sided chest pain that hurts when she breathes. She also has right facial cellulitis worsening for past two days. She uses IV drug and last used was today. She used IV heroin. She reports that the month ago she was at the hospital in [place] by they told her she had bacteria and her blood ended up living against medical advice and not taken any antibiotics thereafter. Patient fever of 102, tachycardia, elevated white count of 36,000 with right facial cellulitis and IV drug track marks present on her arms. She is being admitted with sepsis secondary to IV drug abuse with multifocal pneumonia right facial swelling. During my evaluation patient remained sedated quite sleepy due to Ativan given for her CT scan. History is obtained from ER physician no family in the ER  Hospital Course / Discharge diagnoses: Principal Problem Sepsis due to MRSA bacteremia with secondary multifocal pneumonia and right facial cellulitis in the setting of IV drug use -Infectious disease consulted, recommendations are for IV vancomycin for 2 weeks (since TEE was negative) followed by 2 weeks of linezolid p.o. She finished her vancomycin on 08/02/2019, care management consulted to assist with linezolid as an outpatient. 2D echo did not show any vegetation, underwent TEE on 11/10 without evidence of  endocarditis. CT maxillofacial 11/2 showed improvement and clinically her right facial cellulitis is improving. Chest x-ray done 11/11 shows improvement in her multifocal pneumonia, she is stable on room air  Active Problems Heroin use / anxiety / chronic pain - very short 3 day course of pain and anxiety medications given until she contacts her PCP Hep C positive - RNA negative. Outpatient follow up  Discharge Instructions   Allergies as of 08/02/2019      Reactions   Sulfa Antibiotics Shortness Of Breath      Medication List    STOP taking these medications   levofloxacin 500 MG tablet Commonly known as: LEVAQUIN     TAKE these medications   buprenorphine-naloxone 8-2 mg Subl SL tablet Commonly known as: SUBOXONE Place 1 tablet under the tongue daily.   clonazePAM 0.5 MG tablet Commonly known as: KLONOPIN Take 1 tablet (0.5 mg total) by mouth 2 (two) times daily for 3 days.   linezolid 600 MG tablet Commonly known as: ZYVOX Take 1 tablet (600 mg total) by mouth 2 (two) times daily for 14 days.   oxyCODONE-acetaminophen 5-325 MG tablet Commonly known as: PERCOCET/ROXICET Take 1 tablet by mouth every 6 (six) hours as needed for up to 3 days for moderate pain or severe pain.      Consultations:  ID  Procedures/Studies: 2D echo:  IMPRESSIONS 1. Left ventricular ejection fraction, by visual estimation, is 60 to 65%. The left ventricle has normal function. There is no left ventricular hypertrophy. 2. Global right ventricle has normal systolic function.The right ventricular size is normal. No increase in right ventricular wall thickness. 3. Left atrial size was normal.  4. Mild mitral valve regurgitation. 5. Tricuspid valve regurgitation is mild. 6. Normal pulmonary artery systolic pressure.  TEE IMPRESSIONS 1. No evidence of vegetation. 2. Left ventricular ejection fraction, by visual estimation, is 60 to 65%. The left ventricle has normal function. There  is no left ventricular hypertrophy. 3. Global right ventricle has normal systolic function.The right ventricular size is normal. Right vetricular wall thickness was not assessed. 4. Left atrial size was not assessed. 5. Right atrial size was not assessed. 6. The mitral valve is normal in structure. Trace mitral valve regurgitation. 7. No trisuspid valve vegetation visualized. 8. The tricuspid valve is normal in structure. Tricuspid valve regurgitation is trivial. 9. The aortic valve is tricuspid. Aortic valve regurgitation is not visualized. 10. No pulmonic vegetation present. 11. The pulmonic valve was grossly normal. Pulmonic valve regurgitation is trivial.  Microbiology  Blood cultures 07/21/2019-no growth Aerobic culture, face-MRSA sensitive to tetracycline and clindamycin Blood cultures 07/19/2019-MRSA Urine culture-no significant growth  Dg Chest 2 View  Result Date: 07/31/2019 CLINICAL DATA:  MRSA bacteremia. Polysubstance abuse. EXAM: CHEST - 2 VIEW COMPARISON:  Chest x-ray dated 07/19/2019 FINDINGS: There are improving multifocal airspace opacities bilaterally consistent with the patient's history of septic emboli. There is no pneumothorax. There are trace bilateral pleural effusions. The heart size is relatively normal. There is no acute osseous abnormality. IMPRESSION: Improving multifocal airspace opacities. Persistent trace bilateral pleural effusions. Electronically Signed   By: Constance Holster M.D.   On: 07/31/2019 18:35   Dg Chest 2 View  Result Date: 07/19/2019 CLINICAL DATA:  Chest pain, IV drug abuse. Additional history provided: Patient presents with facial swelling to right side of face for 2 days, bacteremia per patient EXAM: CHEST - 2 VIEW COMPARISON:  Chest radiograph 01/23/2017 FINDINGS: Shallow inspiration radiograph with bibasilar atelectasis. There is multifocal opacity within the mid to lower left lung and possibly also at the right lung base. No  evidence of pleural effusion or pneumothorax. Heart size within normal limits. No acute bony abnormality. IMPRESSION: Multifocal opacity within the mid to lower left lung and possibly also at the right lung base. Findings likely reflect multifocal pneumonia. Electronically Signed   By: Kellie Simmering DO   On: 07/19/2019 17:10   Ct Angio Chest Pe W And/or Wo Contrast  Result Date: 07/19/2019 CLINICAL DATA:  Facial swelling for 2 days, IVDU, chest pain, bacteremia EXAM: CT ANGIOGRAPHY CHEST CT ABDOMEN AND PELVIS WITH CONTRAST TECHNIQUE: Multidetector CT imaging of the chest was performed using the standard protocol during bolus administration of intravenous contrast. Multiplanar CT image reconstructions and MIPs were obtained to evaluate the vascular anatomy. Multidetector CT imaging of the abdomen and pelvis was performed using the standard protocol during bolus administration of intravenous contrast. CONTRAST:  48mL OMNIPAQUE IOHEXOL 350 MG/ML SOLN COMPARISON:  CT 09/08/2016 FINDINGS: CTA CHEST FINDINGS Cardiovascular: Satisfactory opacification the pulmonary arteries to the segmental level. No pulmonary artery filling defects are identified. Central pulmonary arteries are normal caliber. No elevation of the RV/LV ratio (0.6). Nonaneurysmal thoracic aorta. Normal 3 vessel arch. Normal heart size. No pericardial effusion. Mediastinum/Nodes: Numerous low-attenuation mediastinal and hilar lymph nodes without pathologically enlarged adenopathy. No axillary adenopathy. No acute tracheal or esophageal abnormality is seen. Thyroid gland is unremarkable. There is extensive soft tissue stranding at base of the right neck with cervical and submandibular adenopathy partially included in the level of imaging. Dedicated maxillofacial CT with contrast was same time. Lungs/Pleura: Multifocal areas of peripherally distributed airspace opacity with surrounding ground-glass attenuation.  Many of these demonstrate central cavitation  with a feeding vessel sign. The largest is a masslike consolidation along the apicoposterior segment of the left upper lobe abutting the pleural surface. There is some bandlike areas of opacity likely reflecting atelectasis or scarring. No pneumothorax or effusion. Musculoskeletal: No chest wall abnormality. No acute or significant osseous findings. Review of the MIP images confirms the above findings. CT ABDOMEN and PELVIS FINDINGS Hepatobiliary: Stable size of a 2.3 cm centrally hypoattenuating lesion in the hepatic dome (5/15) with some peripheral nodular discontinuous enhancement which demonstrates centripetal filling on the comparison CT compatible with hepatic hemangioma. Smaller subcentimeter hypoattenuating focus in the posterior right lobe (5/10) is stable from prior but remains too small to fully characterize on CT imaging but statistically likely benign. There is diffuse periportal edema. The gallbladder is largely decompressed around a peripherally calcified gallstone in the gallbladder fundus. Gallbladder wall is thickened. No pericholecystic fluid or inflammatory stranding. No calcified intraductal gallstones are seen. No biliary ductal dilatation. Pancreas: Unremarkable. No pancreatic ductal dilatation or surrounding inflammatory changes. Spleen: Normal in size without focal abnormality. Adrenals/Urinary Tract: Adrenal glands are unremarkable. Kidneys are normal, without renal calculi, focal lesion, or hydronephrosis. Circumferential thickening of bladder wall. Stomach/Bowel: Distal esophagus, stomach and duodenal sweep are unremarkable. No small bowel wall thickening or dilatation. Extensive fecalization of the distal small bowel contents. Large volume of stool present throughout the colon. No frank colonic wall thickening or dilatation. There are postsurgical changes at the cecal tip which may reflect prior appendectomy. Vascular/Lymphatic: Portal and hepatic veins appear grossly patent. The aorta  is normal caliber. Difficult to assess adenopathy given a paucity of intraperitoneal fat. There is diffusely increased attenuation of the what mesenteric fat is present. Reproductive: Heterogeneous enhancement of the uterus with some loss of clear definition between the uterine borders in the broad ligament. The ovaries are poorly discernible given the proximity of the numerous adjacent bowel loops and hazy stranding in the adjacent fat. Other: Small volume of free fluid in the pelvis. Diffusely increased attenuation of the mesentery. No free air. Musculoskeletal: No acute osseous abnormality or suspicious osseous lesion. Review of the MIP images confirms the above findings. IMPRESSION: 1. Multifocal areas of peripherally distributed airspace opacity with surrounding ground-glass attenuation. Many of these demonstrate central cavitation with a feeding vessel sign, consistent with septic emboli despite the absence of more central filling defects on the CT angiography. 2. Heterogeneous enhancement of the uterus with some loss of clear definition between the uterine borders in the broad ligament. Findings are concerning for pelvic inflammatory disease. Recommend further evaluation with pelvic ultrasound as clinically indicated. 3. Diffuse periportal edema and gallbladder wall thickening, nonspecific, but can be seen with volume overload or intrinsic liver disease such as hepatitis. Thickened gallbladder appears contracted around a calcified gallstone. If there is concern for acute cholecystitis, could consider right upper quadrant ultrasound. 4. Large volume of stool throughout the colon with fecalization of the distal small bowel contents, suggestive of delayed transit. No evidence of bowel obstruction. 5. Circumferential thickening of bladder wall may reflect cystitis. Correlate with urinalysis. 6. Extensive soft tissue swelling and adenopathy seen at the base of the right neck with subcutaneous edema. Better  assessed on dedicated maxillofacial CT. 7. Aortic Atherosclerosis (ICD10-I70.0). These results were called by telephone at the time of interpretation on 07/19/2019 at 7:11 pm to provider PHILLIP [name] , who verbally acknowledged these results. Electronically Signed   By: Kreg Shropshire M.D.   On: 07/19/2019  19:10   Ct Abdomen Pelvis W Contrast  Result Date: 07/19/2019 CLINICAL DATA:  Facial swelling for 2 days, IVDU, chest pain, bacteremia EXAM: CT ANGIOGRAPHY CHEST CT ABDOMEN AND PELVIS WITH CONTRAST TECHNIQUE: Multidetector CT imaging of the chest was performed using the standard protocol during bolus administration of intravenous contrast. Multiplanar CT image reconstructions and MIPs were obtained to evaluate the vascular anatomy. Multidetector CT imaging of the abdomen and pelvis was performed using the standard protocol during bolus administration of intravenous contrast. CONTRAST:  75mL OMNIPAQUE IOHEXOL 350 MG/ML SOLN COMPARISON:  CT 09/08/2016 FINDINGS: CTA CHEST FINDINGS Cardiovascular: Satisfactory opacification the pulmonary arteries to the segmental level. No pulmonary artery filling defects are identified. Central pulmonary arteries are normal caliber. No elevation of the RV/LV ratio (0.6). Nonaneurysmal thoracic aorta. Normal 3 vessel arch. Normal heart size. No pericardial effusion. Mediastinum/Nodes: Numerous low-attenuation mediastinal and hilar lymph nodes without pathologically enlarged adenopathy. No axillary adenopathy. No acute tracheal or esophageal abnormality is seen. Thyroid gland is unremarkable. There is extensive soft tissue stranding at base of the right neck with cervical and submandibular adenopathy partially included in the level of imaging. Dedicated maxillofacial CT with contrast was same time. Lungs/Pleura: Multifocal areas of peripherally distributed airspace opacity with surrounding ground-glass attenuation. Many of these demonstrate central cavitation with a feeding  vessel sign. The largest is a masslike consolidation along the apicoposterior segment of the left upper lobe abutting the pleural surface. There is some bandlike areas of opacity likely reflecting atelectasis or scarring. No pneumothorax or effusion. Musculoskeletal: No chest wall abnormality. No acute or significant osseous findings. Review of the MIP images confirms the above findings. CT ABDOMEN and PELVIS FINDINGS Hepatobiliary: Stable size of a 2.3 cm centrally hypoattenuating lesion in the hepatic dome (5/15) with some peripheral nodular discontinuous enhancement which demonstrates centripetal filling on the comparison CT compatible with hepatic hemangioma. Smaller subcentimeter hypoattenuating focus in the posterior right lobe (5/10) is stable from prior but remains too small to fully characterize on CT imaging but statistically likely benign. There is diffuse periportal edema. The gallbladder is largely decompressed around a peripherally calcified gallstone in the gallbladder fundus. Gallbladder wall is thickened. No pericholecystic fluid or inflammatory stranding. No calcified intraductal gallstones are seen. No biliary ductal dilatation. Pancreas: Unremarkable. No pancreatic ductal dilatation or surrounding inflammatory changes. Spleen: Normal in size without focal abnormality. Adrenals/Urinary Tract: Adrenal glands are unremarkable. Kidneys are normal, without renal calculi, focal lesion, or hydronephrosis. Circumferential thickening of bladder wall. Stomach/Bowel: Distal esophagus, stomach and duodenal sweep are unremarkable. No small bowel wall thickening or dilatation. Extensive fecalization of the distal small bowel contents. Large volume of stool present throughout the colon. No frank colonic wall thickening or dilatation. There are postsurgical changes at the cecal tip which may reflect prior appendectomy. Vascular/Lymphatic: Portal and hepatic veins appear grossly patent. The aorta is normal  caliber. Difficult to assess adenopathy given a paucity of intraperitoneal fat. There is diffusely increased attenuation of the what mesenteric fat is present. Reproductive: Heterogeneous enhancement of the uterus with some loss of clear definition between the uterine borders in the broad ligament. The ovaries are poorly discernible given the proximity of the numerous adjacent bowel loops and hazy stranding in the adjacent fat. Other: Small volume of free fluid in the pelvis. Diffusely increased attenuation of the mesentery. No free air. Musculoskeletal: No acute osseous abnormality or suspicious osseous lesion. Review of the MIP images confirms the above findings. IMPRESSION: 1. Multifocal areas of peripherally distributed airspace opacity  with surrounding ground-glass attenuation. Many of these demonstrate central cavitation with a feeding vessel sign, consistent with septic emboli despite the absence of more central filling defects on the CT angiography. 2. Heterogeneous enhancement of the uterus with some loss of clear definition between the uterine borders in the broad ligament. Findings are concerning for pelvic inflammatory disease. Recommend further evaluation with pelvic ultrasound as clinically indicated. 3. Diffuse periportal edema and gallbladder wall thickening, nonspecific, but can be seen with volume overload or intrinsic liver disease such as hepatitis. Thickened gallbladder appears contracted around a calcified gallstone. If there is concern for acute cholecystitis, could consider right upper quadrant ultrasound. 4. Large volume of stool throughout the colon with fecalization of the distal small bowel contents, suggestive of delayed transit. No evidence of bowel obstruction. 5. Circumferential thickening of bladder wall may reflect cystitis. Correlate with urinalysis. 6. Extensive soft tissue swelling and adenopathy seen at the base of the right neck with subcutaneous edema. Better assessed on  dedicated maxillofacial CT. 7. Aortic Atherosclerosis (ICD10-I70.0). These results were called by telephone at the time of interpretation on 07/19/2019 at 7:11 pm to provider PHILLIP [name] , who verbally acknowledged these results. Electronically Signed   By: Kreg ShropshirePrice  DeHay M.D.   On: 07/19/2019 19:10   Ct Maxillofacial W Contrast  Result Date: 07/23/2019 CLINICAL DATA:  38 year old female with right facial cellulitis. History of IV drug use. Blood cultures positive for MRSA. EXAM: CT MAXILLOFACIAL WITH CONTRAST TECHNIQUE: Multidetector CT imaging of the maxillofacial structures was performed with intravenous contrast. Multiplanar CT image reconstructions were also generated. CONTRAST:  75mL OMNIPAQUE IOHEXOL 300 MG/ML  SOLN COMPARISON:  Face CT 07/19/2019. FINDINGS: Osseous: Mandible intact. Normal TMJ alignment. There is mild posterior right mandible molar periapical lucency. More pronounced left posterior maxillary molar apical lucency on series 7, image 49. Maxilla and zygoma otherwise intact. Nasal bones intact. No acute osseous abnormality identified. Orbits: Orbital walls intact. The globes and intraorbital soft tissues appears symmetric and normal. Mild right periorbital and more pronounced right lateral face soft tissue swelling and stranding which continues from above the right zygoma to the right submandibular level. The swelling has mildly improved since 07/19/2019 when comparing coronal images (series 4, image 42 today versus series 7, image 32 previously). There is no associated soft tissue gas, and no associated soft tissue abscess. Sinuses: There is a 19 millimeter erosion of the nasal septum (series 7, image 40). Only trace paranasal sinus mucosal thickening and retained secretions in the nasal cavity. Tympanic cavities and mastoids are clear. Soft tissues: Right face superficial soft tissue abnormality described above. Negative visible thyroid, larynx, pharynx, parapharyngeal spaces,  retropharyngeal space and sublingual space. There is mild inflammation in the right submandibular space but the right submandibular gland appears to remain normal. Similar findings at the right parotid space but parotid gland enhancement is symmetric and within normal limits. Reactive appearing right level 1 and level 2 lymph nodes are stable. No cystic or necrotic nodes. The visible major vascular structures in the face and at the skull base appear patent. Limited intracranial: Negative. IMPRESSION: 1. Right facial cellulitis has mildly improved since 07/19/2019. No associated abscess, new or other complicating features. 2. Unrelated appearing bilateral molar dental periapical lucency. Chronic erosion of the nasal septum. Electronically Signed   By: Odessa FlemingH  Hall M.D.   On: 07/23/2019 12:21   Ct Maxillofacial W Contrast  Result Date: 07/19/2019 CLINICAL DATA:  Right facial swelling.  IV drug abuse. EXAM: CT MAXILLOFACIAL WITH CONTRAST  TECHNIQUE: Multidetector CT imaging of the maxillofacial structures was performed with intravenous contrast. Multiplanar CT image reconstructions were also generated. CONTRAST:  75mL OMNIPAQUE IOHEXOL 350 MG/ML SOLN COMPARISON:  None. FINDINGS: Osseous: Negative for fracture. No acute bony abnormality. Periapical lucency around left upper third molar. Mild lucency around right lower third molar. Orbits: No soft tissue edema or mass. Sinuses: Clear bilaterally. Soft tissues: Extensive soft tissue swelling throughout the right face overlying the maxilla. No focal mass or abscess. 9 mm right submandibular lymph node. 13 mm right level 2 lymph node. These are likely reactive due to cellulitis. Limited intracranial: Negative IMPRESSION: Extensive soft tissue swelling right face. No abscess or mass. Probable cellulitis. No definite dental source identified. Electronically Signed   By: Marlan Palau M.D.   On: 07/19/2019 18:41     Subjective: - no chest pain, shortness of breath, no  abdominal pain, nausea or vomiting.   Discharge Exam: BP (!) 136/103 (BP Location: Left Arm)    Pulse (!) 107    Temp 98.6 F (37 C) (Oral)    Resp 18    Ht  (1.626 m)    Wt 54.4 kg    LMP 02/16/2019 Comment: tubes tide   SpO2 100%    BMI 20.59 kg/m   General: Pt is alert, awake, not in acute distress Cardiovascular: RRR, S1/S2 +, no rubs, no gallops Respiratory: CTA bilaterally, no wheezing, no rhonchi Abdominal: Soft, NT, ND, bowel sounds + Extremities: no edema, no cyanosis    The results of significant diagnostics from this hospitalization (including imaging, microbiology, ancillary and laboratory) are listed below for reference.     Microbiology: No results found for this or any previous visit (from the past 240 hour(s)).   Labs: Basic Metabolic Panel: Recent Labs  Lab 07/27/19 0742 07/29/19 0430 07/31/19 0429 08/02/19 0507  NA 137 137  --   --   K 3.9 4.3  --   --   CL 103 99  --   --   CO2 26 29  --   --   GLUCOSE 95 107*  --   --   BUN 11 12  --   --   CREATININE 0.71 0.69 0.61 0.78  CALCIUM 8.0* 8.9  --   --    Liver Function Tests: No results for input(s): AST, ALT, ALKPHOS, BILITOT, PROT, ALBUMIN in the last 168 hours. CBC: Recent Labs  Lab 08/01/19 0355  WBC 12.7*  NEUTROABS 6.0  HGB 9.2*  HCT 29.0*  MCV 81.7  PLT 682*   CBG: No results for input(s): GLUCAP in the last 168 hours. Hgb A1c No results for input(s): HGBA1C in the last 72 hours. Lipid Profile No results for input(s): CHOL, HDL, LDLCALC, TRIG, CHOLHDL, LDLDIRECT in the last 72 hours. Thyroid function studies No results for input(s): TSH, T4TOTAL, T3FREE, THYROIDAB in the last 72 hours.  Invalid input(s): FREET3 Urinalysis    Component Value Date/Time   COLORURINE AMBER (A) 07/19/2019 1635   APPEARANCEUR HAZY (A) 07/19/2019 1635   LABSPEC 1.029 07/19/2019 1635   PHURINE 5.0 07/19/2019 1635   GLUCOSEU NEGATIVE 07/19/2019 1635   HGBUR NEGATIVE 07/19/2019 1635   BILIRUBINUR  NEGATIVE 07/19/2019 1635   KETONESUR NEGATIVE 07/19/2019 1635   PROTEINUR 100 (A) 07/19/2019 1635   NITRITE NEGATIVE 07/19/2019 1635   LEUKOCYTESUR NEGATIVE 07/19/2019 1635    FURTHER DISCHARGE INSTRUCTIONS:   Get Medicines reviewed and adjusted: Please take all your medications with you for your next visit  with your Primary MD   Laboratory/radiological data: Please request your Primary MD to go over all hospital tests and procedure/radiological results at the follow up, please ask your Primary MD to get all Hospital records sent to his/her office.   In some cases, they will be blood work, cultures and biopsy results pending at the time of your discharge. Please request that your primary care M.D. goes through all the records of your hospital data and follows up on these results.   Also Note the following: If you experience worsening of your admission symptoms, develop shortness of breath, life threatening emergency, suicidal or homicidal thoughts you must seek medical attention immediately by calling 911 or calling your MD immediately  if symptoms less severe.   You must read complete instructions/literature along with all the possible adverse reactions/side effects for all the Medicines you take and that have been prescribed to you. Take any new Medicines after you have completely understood and accpet all the possible adverse reactions/side effects.    Do not drive when taking Pain medications or sleeping medications (Benzodaizepines)   Do not take more than prescribed Pain, Sleep and Anxiety Medications. It is not advisable to combine anxiety,sleep and pain medications without talking with your primary care practitioner   Special Instructions: If you have smoked or chewed Tobacco  in the last 2 yrs please stop smoking, stop any regular Alcohol  and or any Recreational drug use.   Wear Seat belts while driving.   Please note: You were cared for by a hospitalist during your hospital  stay. Once you are discharged, your primary care physician will handle any further medical issues. Please note that NO REFILLS for any discharge medications will be authorized once you are discharged, as it is imperative that you return to your primary care physician (or establish a relationship with a primary care physician if you do not have one) for your post hospital discharge needs so that they can reassess your need for medications and monitor your lab values.  Time coordinating discharge: 35 minutes  SIGNED:  Pamella Pert, MD, PhD 08/02/2019, 1:24 PM

## 2019-08-02 NOTE — Progress Notes (Signed)
Pharmacy - Antimicrobial Stewardship   Dr Delaine Lame plan to d/c on linezolid 600mg  PO BID x 2 weeks.  Worked with CM Doran Clay) to get patient approved for [facility] program.    Prescription send to [facility] on [address] and confirmed [facility] voucher went through.  Cost will be $3.  Pharmacy has medication in [place], PharmD, BCPS.   Work Cell: [phone] 08/02/2019 1:27 PM

## 2020-02-07 ENCOUNTER — Emergency Department (HOSPITAL_COMMUNITY): Payer: Self-pay

## 2020-02-07 ENCOUNTER — Emergency Department (HOSPITAL_COMMUNITY)
Admission: EM | Admit: 2020-02-07 | Discharge: 2020-02-07 | Disposition: A | Discharge status: Home / Self Care | Payer: Self-pay | Attending: Emergency Medicine | Admitting: Emergency Medicine

## 2020-02-07 ENCOUNTER — Encounter (HOSPITAL_COMMUNITY): Payer: Self-pay | Admitting: Emergency Medicine

## 2020-02-07 DIAGNOSIS — M79601 Pain in right arm: Secondary | ICD-10-CM | POA: Insufficient documentation

## 2020-02-07 DIAGNOSIS — Z79899 Other long term (current) drug therapy: Secondary | ICD-10-CM | POA: Insufficient documentation

## 2020-02-07 DIAGNOSIS — W19XXXA Unspecified fall, initial encounter: Secondary | ICD-10-CM

## 2020-02-07 DIAGNOSIS — W010XXD Fall on same level from slipping, tripping and stumbling without subsequent striking against object, subsequent encounter: Secondary | ICD-10-CM | POA: Insufficient documentation

## 2020-02-07 DIAGNOSIS — Z23 Encounter for immunization: Secondary | ICD-10-CM | POA: Insufficient documentation

## 2020-02-07 DIAGNOSIS — F1920 Other psychoactive substance dependence, uncomplicated: Secondary | ICD-10-CM | POA: Insufficient documentation

## 2020-02-07 DIAGNOSIS — F17210 Nicotine dependence, cigarettes, uncomplicated: Secondary | ICD-10-CM | POA: Insufficient documentation

## 2020-02-07 MED ORDER — DOXYCYCLINE HYCLATE 100 MG PO TABS
100.0000 mg | ORAL_TABLET | Freq: Once | ORAL | Status: AC
  Administered 2020-02-07: 100 mg via ORAL
  Filled 2020-02-07: qty 1

## 2020-02-07 MED ORDER — TETANUS-DIPHTH-ACELL PERTUSSIS 5-2.5-18.5 LF-MCG/0.5 IM SUSP
0.5000 mL | Freq: Once | INTRAMUSCULAR | Status: AC
  Administered 2020-02-07: 0.5 mL via INTRAMUSCULAR
  Filled 2020-02-07: qty 0.5

## 2020-02-07 MED ORDER — DOXYCYCLINE HYCLATE 100 MG PO CAPS: 100.0000 mg | ORAL_CAPSULE | Freq: Two times a day (BID) | ORAL | 0 refills | Status: AC

## 2020-02-07 NOTE — Discharge Instructions (Addendum)
Take the antibiotics as prescribed.  The cut on your right hand will gradually heal.  If you notice any new worsening symptoms please seek reevaluation
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
## 2020-02-07 NOTE — ED Notes (Signed)
Discussed discharge instructions and medications with patient.  All questions addressed. Patient transported under police custody to the [facility].
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
## 2020-07-02 IMAGING — DX DG HAND COMPLETE 3+V*R*
3 series · 3 of 3 positions shown · non-contrast
Comparison: None.

CLINICAL DATA: Post fall with right hand and forearm pain. IV drug
use. Patient reports recent fall.

EXAM:
RIGHT HAND - COMPLETE 3+ VIEW

[hand ap]
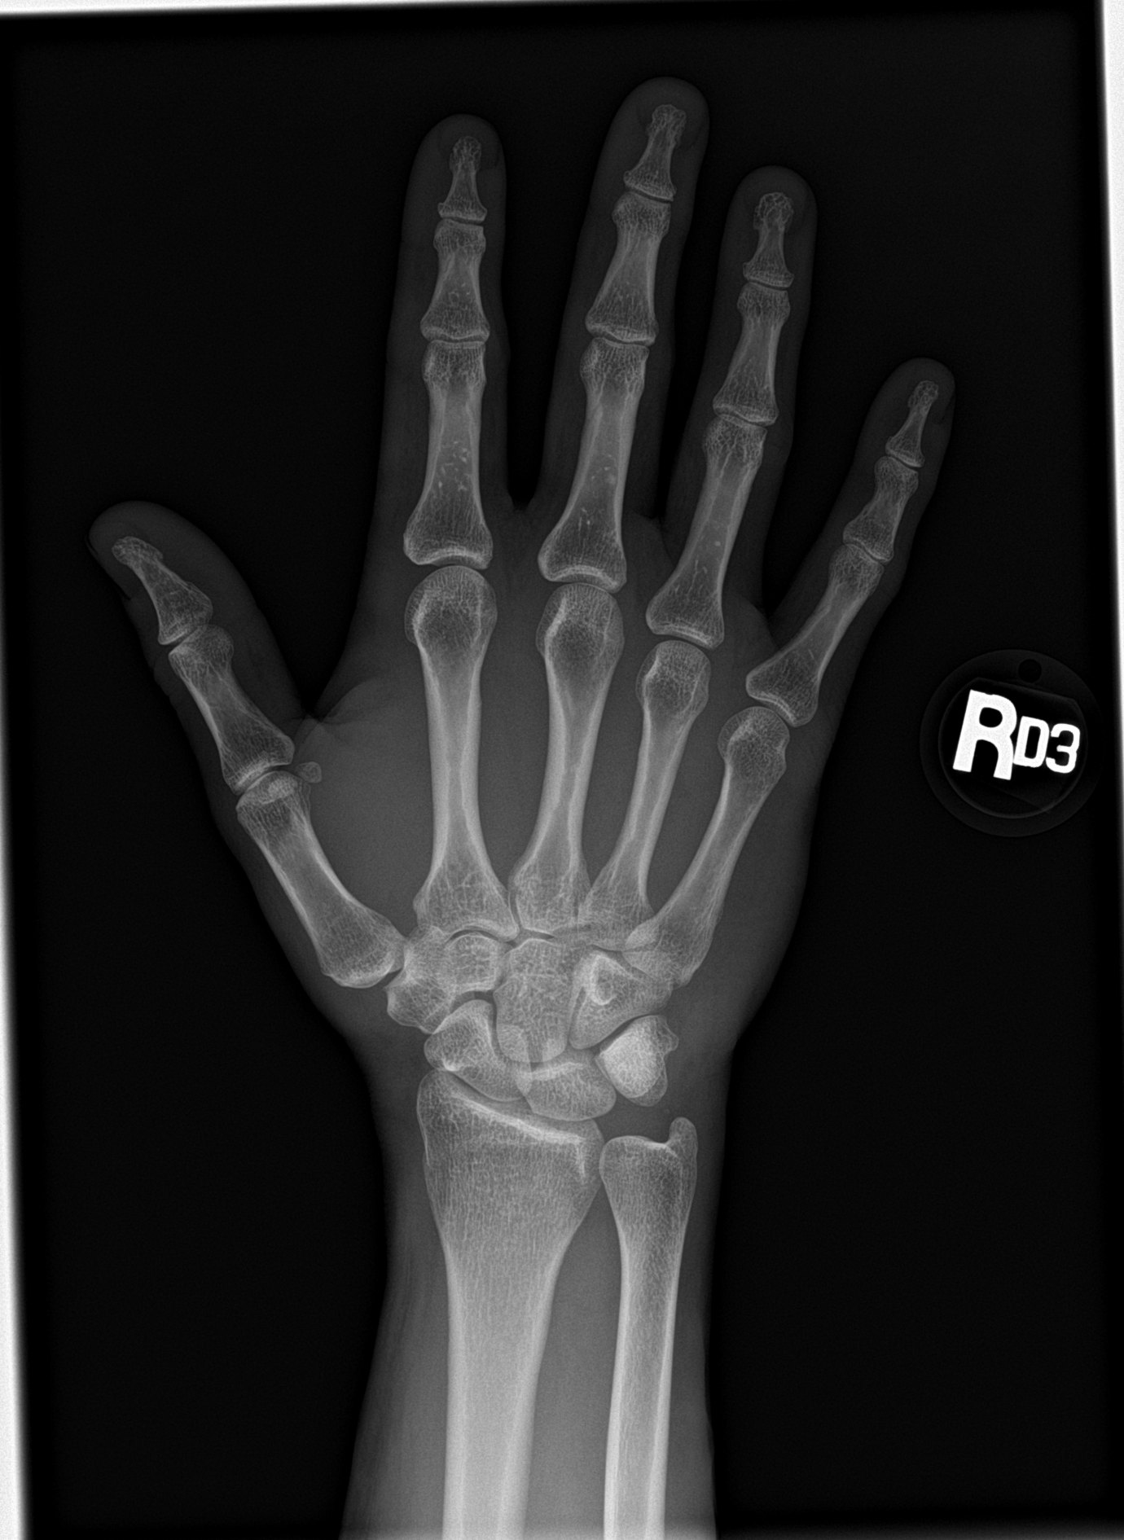

[hand obl]
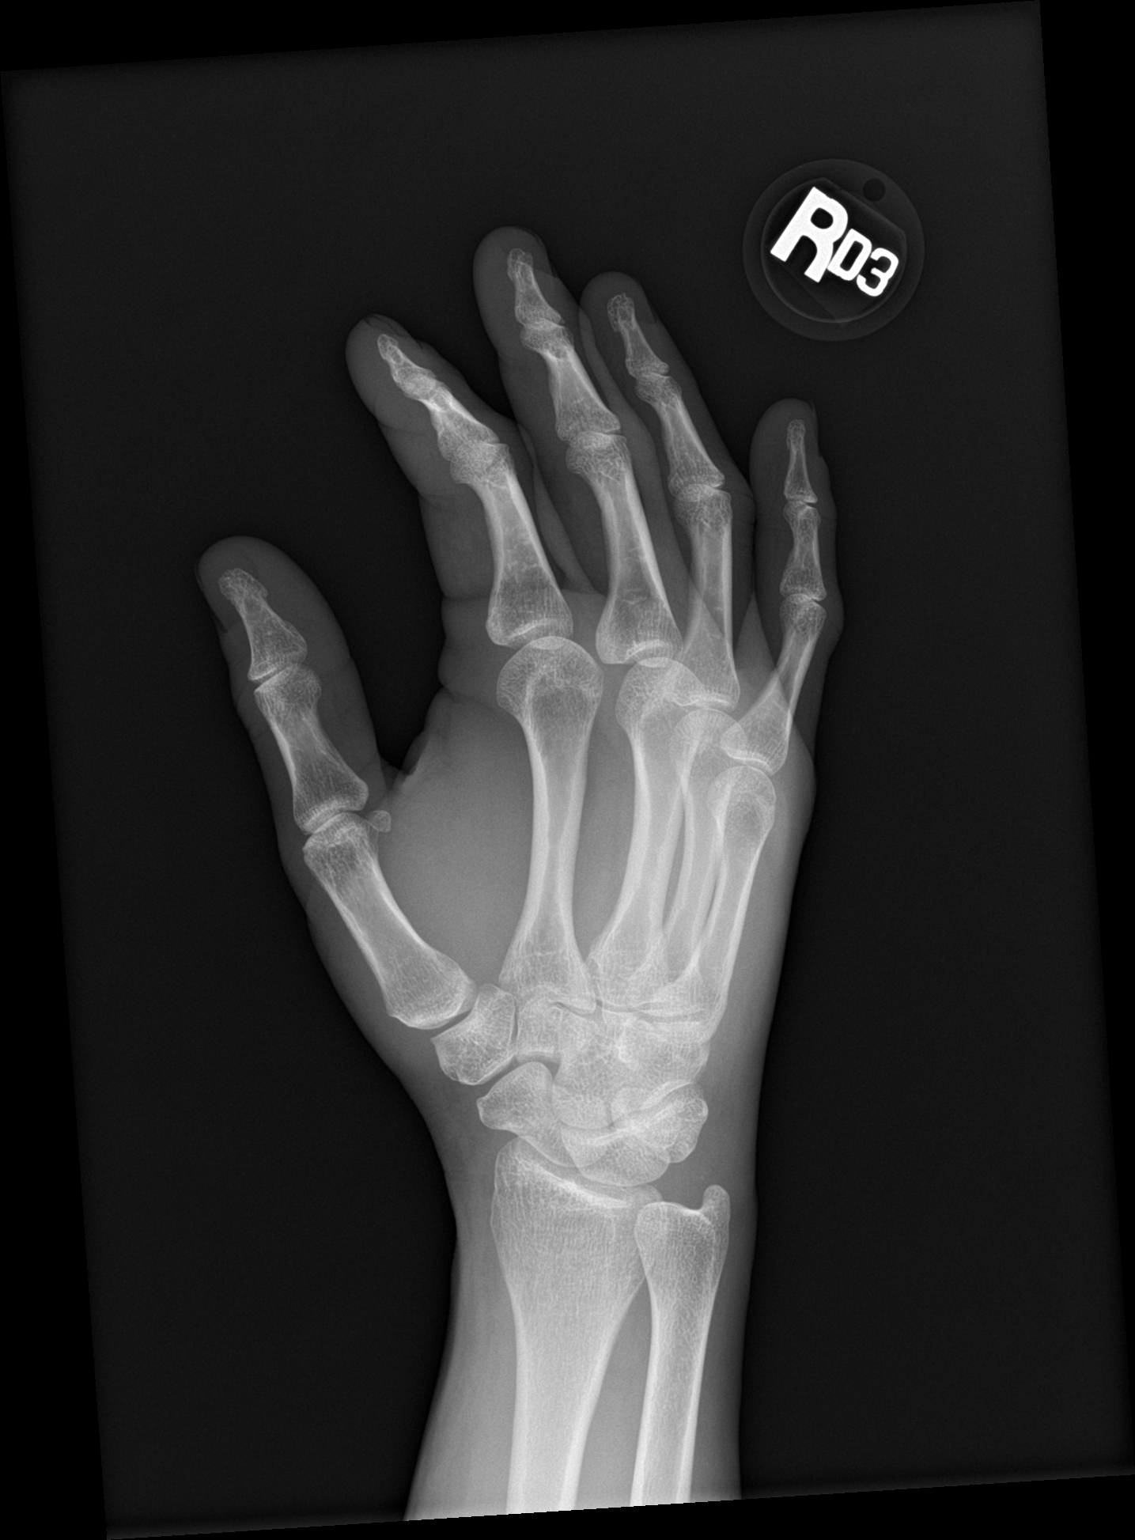

[hand lat]
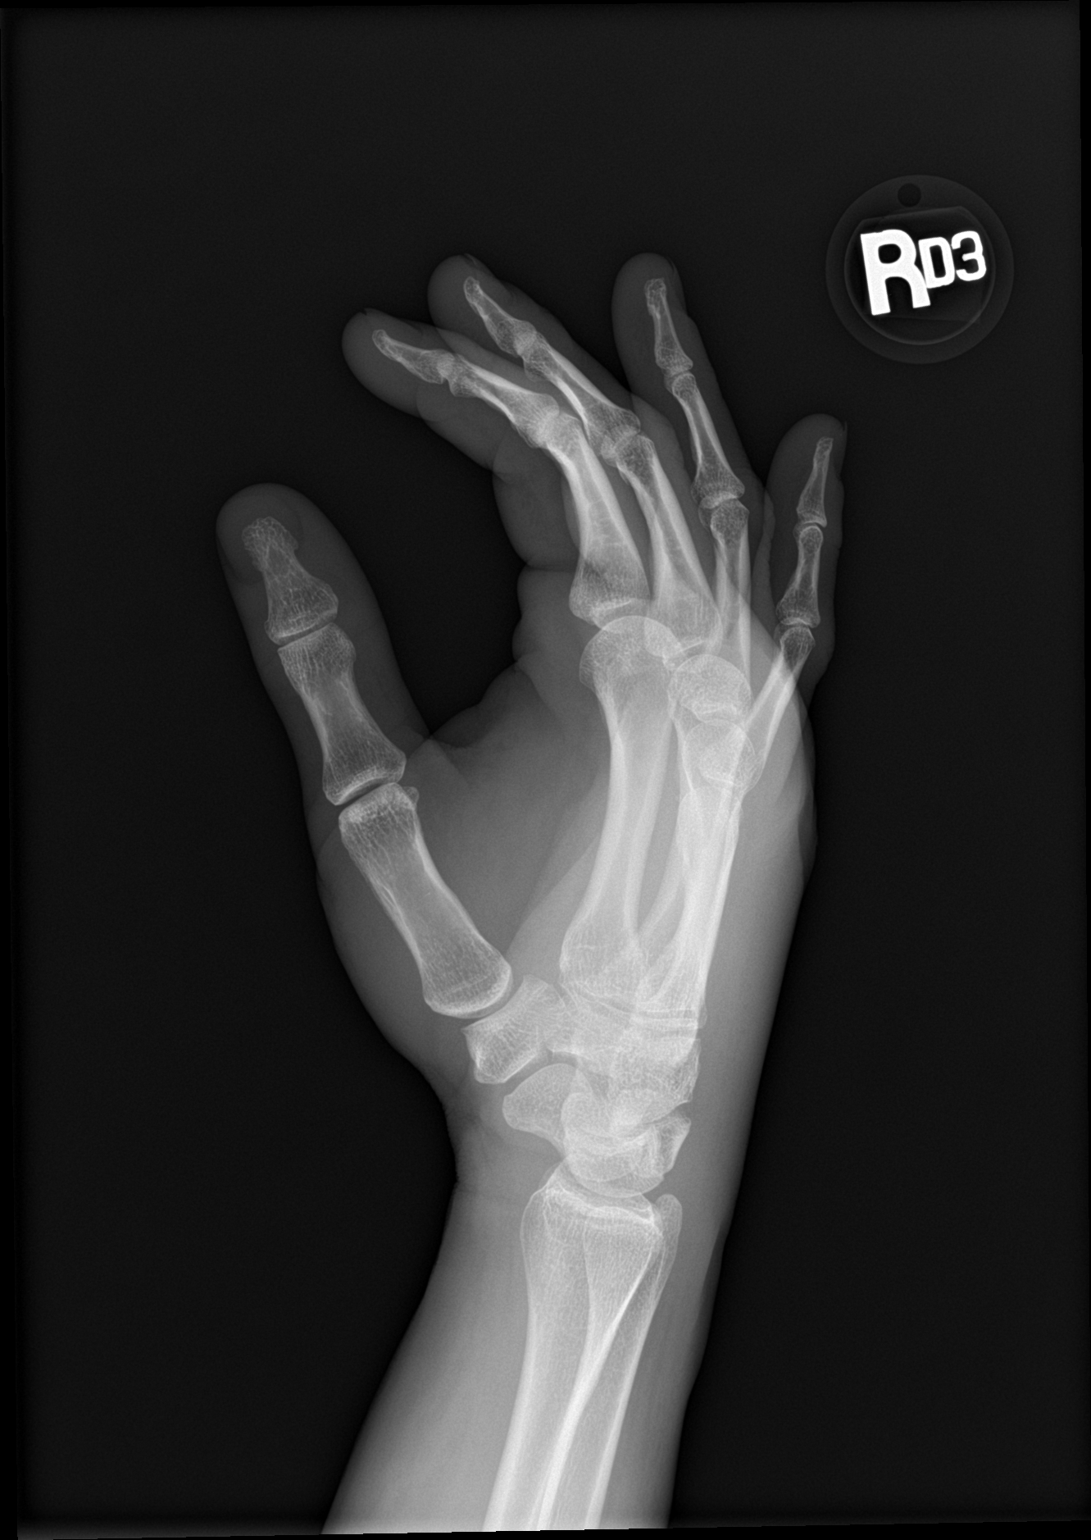

[3 of 3 positions shown; findings below may reference images not displayed]

FINDINGS: There is no evidence of fracture or dislocation. There is no
evidence of arthropathy or other focal bone abnormality. No erosion
or periosteal reaction. No bony destruction. There is dorsal soft
tissue edema. No soft tissue air or radiopaque foreign body.
IMPRESSION: Dorsal soft tissue edema. No osseous abnormality.

## 2021-11-11 ENCOUNTER — Ambulatory Visit (HOSPITAL_COMMUNITY)
Admission: EM | Admit: 2021-11-11 | Discharge: 2021-11-11 | Disposition: A | Discharge status: Home / Self Care | Payer: No Payment, Other | Attending: Psychiatry | Admitting: Psychiatry

## 2021-11-11 ENCOUNTER — Other Ambulatory Visit: Payer: Self-pay

## 2021-11-11 ENCOUNTER — Encounter (HOSPITAL_COMMUNITY): Payer: Self-pay | Admitting: Emergency Medicine

## 2021-11-11 ENCOUNTER — Emergency Department (HOSPITAL_COMMUNITY)
Admission: EM | Admit: 2021-11-11 | Discharge: 2021-11-12 | Disposition: A | Discharge status: Home / Self Care | Payer: Medicaid Other | Attending: Emergency Medicine | Admitting: Emergency Medicine

## 2021-11-11 DIAGNOSIS — S80811A Abrasion, right lower leg, initial encounter: Secondary | ICD-10-CM | POA: Insufficient documentation

## 2021-11-11 DIAGNOSIS — F4310 Post-traumatic stress disorder, unspecified: Secondary | ICD-10-CM | POA: Insufficient documentation

## 2021-11-11 DIAGNOSIS — Z79899 Other long term (current) drug therapy: Secondary | ICD-10-CM | POA: Insufficient documentation

## 2021-11-11 DIAGNOSIS — L02416 Cutaneous abscess of left lower limb: Secondary | ICD-10-CM | POA: Insufficient documentation

## 2021-11-11 DIAGNOSIS — F419 Anxiety disorder, unspecified: Secondary | ICD-10-CM | POA: Insufficient documentation

## 2021-11-11 DIAGNOSIS — Z5900 Homelessness unspecified: Secondary | ICD-10-CM | POA: Insufficient documentation

## 2021-11-11 DIAGNOSIS — F1110 Opioid abuse, uncomplicated: Secondary | ICD-10-CM | POA: Insufficient documentation

## 2021-11-11 DIAGNOSIS — F1510 Other stimulant abuse, uncomplicated: Secondary | ICD-10-CM | POA: Insufficient documentation

## 2021-11-11 DIAGNOSIS — L02415 Cutaneous abscess of right lower limb: Secondary | ICD-10-CM | POA: Insufficient documentation

## 2021-11-11 DIAGNOSIS — F32A Depression, unspecified: Secondary | ICD-10-CM | POA: Insufficient documentation

## 2021-11-11 DIAGNOSIS — Y900 Blood alcohol level of less than 20 mg/100 ml: Secondary | ICD-10-CM | POA: Insufficient documentation

## 2021-11-11 DIAGNOSIS — F1193 Opioid use, unspecified with withdrawal: Secondary | ICD-10-CM | POA: Insufficient documentation

## 2021-11-11 DIAGNOSIS — S0081XA Abrasion of other part of head, initial encounter: Secondary | ICD-10-CM | POA: Insufficient documentation

## 2021-11-11 DIAGNOSIS — S40811A Abrasion of right upper arm, initial encounter: Secondary | ICD-10-CM | POA: Insufficient documentation

## 2021-11-11 DIAGNOSIS — L0390 Cellulitis, unspecified: Secondary | ICD-10-CM

## 2021-11-11 DIAGNOSIS — X58XXXA Exposure to other specified factors, initial encounter: Secondary | ICD-10-CM | POA: Insufficient documentation

## 2021-11-11 DIAGNOSIS — S80812A Abrasion, left lower leg, initial encounter: Secondary | ICD-10-CM | POA: Insufficient documentation

## 2021-11-11 DIAGNOSIS — S40812A Abrasion of left upper arm, initial encounter: Secondary | ICD-10-CM | POA: Insufficient documentation

## 2021-11-11 DIAGNOSIS — R000 Tachycardia, unspecified: Secondary | ICD-10-CM | POA: Insufficient documentation

## 2021-11-11 LAB — COMPREHENSIVE METABOLIC PANEL
ALT: 19 U/L (ref 0–44)
AST: 27 U/L (ref 15–41)
Albumin: 3.4 g/dL — ABNORMAL LOW (ref 3.5–5.0)
Alkaline Phosphatase: 86 U/L (ref 38–126)
Anion gap: 11 (ref 5–15)
BUN: 9 mg/dL (ref 6–20)
CO2: 26 mmol/L (ref 22–32)
Calcium: 9 mg/dL (ref 8.9–10.3)
Chloride: 97 mmol/L — ABNORMAL LOW (ref 98–111)
Creatinine, Ser: 1.2 mg/dL — ABNORMAL HIGH (ref 0.44–1.00)
GFR, Estimated: 58 mL/min — ABNORMAL LOW (ref >60)
Glucose, Bld: 70 mg/dL (ref 70–99)
Potassium: 3.4 mmol/L — ABNORMAL LOW (ref 3.5–5.1)
Sodium: 134 mmol/L — ABNORMAL LOW (ref 135–145)
Total Bilirubin: 0.3 mg/dL (ref 0.3–1.2)
Total Protein: 7.7 g/dL (ref 6.5–8.1)

## 2021-11-11 LAB — CBC
HCT: 35.7 % — ABNORMAL LOW (ref 36.0–46.0)
Hemoglobin: 11.2 g/dL — ABNORMAL LOW (ref 12.0–15.0)
MCH: 25.5 pg — ABNORMAL LOW (ref 26.0–34.0)
MCHC: 31.4 g/dL (ref 30.0–36.0)
MCV: 81.3 fL (ref 80.0–100.0)
Platelets: 331 10*3/uL (ref 150–400)
RBC: 4.39 MIL/uL (ref 3.87–5.11)
RDW: 15.2 % (ref 11.5–15.5)
WBC: 9.8 10*3/uL (ref 4.0–10.5)
nRBC: 0 % (ref 0.0–0.2)

## 2021-11-11 LAB — RAPID URINE DRUG SCREEN, HOSP PERFORMED
Amphetamines: POSITIVE — AB
Barbiturates: NOT DETECTED
Benzodiazepines: NOT DETECTED
Cocaine: NOT DETECTED
Opiates: NOT DETECTED
Tetrahydrocannabinol: POSITIVE — AB

## 2021-11-11 LAB — I-STAT BETA HCG BLOOD, ED (MC, WL, AP ONLY): I-stat hCG, quantitative: <5 m[IU]/mL (ref <5)

## 2021-11-11 LAB — ETHANOL: Alcohol, Ethyl (B): <10 mg/dL (ref <10)

## 2021-11-11 MED ORDER — DEXTROSE 5 % IV SOLN
1500.0000 mg | Freq: Once | INTRAVENOUS | Status: AC
  Administered 2021-11-12: 1500 mg via INTRAVENOUS
  Filled 2021-11-11: qty 75

## 2021-11-11 NOTE — ED Triage Notes (Signed)
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 No fever or chills .

## 2021-11-11 NOTE — Discharge Instructions (Addendum)
Patient is instructed prior to discharge to:  Take all medications as prescribed by his/her mental healthcare provider. Report any adverse effects and or reactions from the medicines to his/her outpatient provider promptly. Keep all scheduled appointments, to ensure that you are getting refills on time and to avoid any interruption in your medication.  If you are unable to keep an appointment call to reschedule.  Be sure to follow-up with resources and follow-up appointments provided.  Patient has been instructed & cautioned: To not engage in alcohol and or illegal drug use while on prescription medicines. In the event of worsening symptoms, patient is instructed to call the crisis hotline, 911 and or go to the nearest ED for appropriate evaluation and treatment of symptoms. To follow-up with his/her primary care provider for your other medical issues, concerns and or health care needs.    Substance Abuse Treatment Resources listed Below:  [facility] - Admissions are currently completed Monday through Friday at 8am; both appointments and walk-ins are accepted.  Any individual that is a [place] resident may present for a substance abuse screening and assessment for admission.  A person may be referred by numerous sources or self-refer.   Potential clients will be screened for medical necessity and appropriateness for the program.  Clients must meet criteria for high-intensity residential treatment services.  If clinically appropriate, a client will continue with the comprehensive clinical assessment and intake process, as well as enrollment in the [facility].  Address: [address] [place], [place] [address] Admin Hours: Mon-Fri 8AM to 5PM Center Hours: 24/7 Phone: [phone] Fax: [phone]  [facility] Address: [address], [place], [place] [address] Behavioral Health Urgent Care ([facility]) Hours: 24/7 Phone:  [phone] Fax: [phone]  [facility] ([facility]): (offers outpatient therapy and intensive outpatient substance abuse therapy).  [address], [place], [place] [address] Phone: [phone]  [facility]: Offers FREE recovery skills classes, support groups, 1:1 Peer Support, and Compeer Classes. [address], [place], [place] [address] Phone: [phone] (Call to complete intake).   [facility] [address] [place], [place] [address] Phone: [phone] ext [phone] [facility] provides food, shelter and other programs and services to the homeless men of [place] through our men's program.  By offering safe shelter, three meals a day, clean clothing, Biblical counseling, financial planning, vocational training, GED/education and employment assistance, we've helped mend the shattered lives of many homeless men since opening in 1974.  We have approximately 267 beds available, with a max of 312 beds including mats for emergency situations and currently house an average of 270 men a night.  Prospective Client Check-In Information Photo ID Required (State/ Out of State/ DOC) - if photo ID is not available, clients are required to have a printout of a police/sheriff's criminal history report. Help out with chores around the Mission. No sex offender of any type (pending, charged, registered and/or any other sex related offenses) will be permitted to check in. Must be willing to abide by all rules, regulations, and policies established by the [facility]. The following will be provided - shelter, food, clothing, and biblical counseling. If you or someone you know is in need of assistance at our men's shelter in [place], , please call [phone] ext. [phone].  [facility]-will provide timely access to mental health services for children and adolescents (4-17) and adults    presenting in a mental health crisis. The program is designed for those who need urgent Behavioral Health or Substance Use treatment and are not experiencing a medical crisis that would typically require an emergency room visit.    [address] [place], [place] [address] Phone: [phone] [email or web address]  [facility]: Phone#: [phone]  The [facility] SA Intensive Outpatient Program (SAIOP) means structured individual and group addiction activities and services that are provided at an outpatient program designed to assist adult and adolescent consumers to begin recovery and learn skills for recovery maintenance. The [facility] SAIOP program is offered at least 3 hours a day, 3 days a week.SAIOP services shall include a structured program consisting of, but not limited to, the following services: Individual counseling and support; Group counseling and support; Family counseling, training or support; Biochemical assays to identify recent drug use (e.g., urine drug screens); Strategies for relapse prevention to include community and social support systems in treatment; Life skills; Crisis contingency planning; Disease Management; and Treatment support activities that have been adapted or specifically designed for persons with physical disabilities, or persons with co-occurring disorders of mental illness and substance abuse/dependence or mental retardation/developmental disability and substance abuse/dependence. Phone: [phone]  Address:   The [place] [facility] will also offer the following outpatient services: (Monday through Friday 8am-5pm)   Partial Hospitalization Program (PHP) Substance Abuse Intensive Outpatient Program (SA-IOP) Group Therapy Medication Management Peer Living Room We also provide (24/7):  Assessments: Our mental health clinician and providers will conduct a focused mental health evaluation, assessing for immediate  safety concerns and further mental health needs. Referral: Our team will provide resources and help connect to community based mental health treatment, when indicated, including psychotherapy, psychiatry, and other specialized behavioral health or substance use disorder services (for those not already in treatment). Transitional Care: Our team providers in person bridging and/or telephonic follow-up during the patient's transition to outpatient services.  [facility] 24-Hour Call Center: 1-800-256-2452 Behavioral Health Crisis Line: 1-833-600-2054  Homeless Shelter List:     [facility] ([facility])  [address] [place], [place]  Phone: [phone]     [facility]  [address], [place], [place] [address]  Phone: [phone]     [facility] (Women only)  [address], [place], [place] [address]  Phone: [phone]     [facility]  [address] [place], [place] [address]  Phone: [phone]     [facility]:  [address]  [place], [place] [address]  Phone: [phone]     [facility]  [address], [place],  [address]  (Check in at 6:00PM for placement at a local shelter)  Phone: [phone]  

## 2021-11-11 NOTE — Discharge Instructions (Addendum)
If you develop fevers, worsening symptoms or have any new or concerning symptoms please seek additional medical care and evaluation.  Blood cultures were ordered today.  If these are positive you will receive a call.  There is help if you need it.  Please do not use dirty needles, this could cause you a severe infection to your skin, heart or spinal cord.  This could kill you or leave you permanently disabled.  There was a recent study done at [facility] that showed that the risk of death for someone that had unintentionally overdosed on narcotics was as high as 15% in the next year.  This is much higher than most every other medical condition.  [place] [facility]ioid Problem ([facility]STOP) Fixed; mobile; peer-based Anders Grant [phone] [phone] [email or web address] Fixed site exchange at [facility], Wednesdays (2-5pm) and Thursdays (4-8pm). [address] [place], [place] [address] Call or text to arrange mobile and peer exchange, Mondays (1-4pm) and Fridays (4-7pm). Serving [facility] [email or web address]  Suboxone clinic: [facility] [address] [place], [address]  [facility] [address] [place], [address]  [facility] [address] [place] [address]

## 2021-11-11 NOTE — ED Notes (Signed)
Pt discharged in no acute distress. Denied SI/HI/AVH. A&O x4, ambulatory. Verbalized understanding of AVS instructions reviewed by RN. Belongings returned to pt intact from orange locker. Pt escorted to front lobby by staff. Safety maintained.  

## 2021-11-11 NOTE — ED Provider Notes (Signed)
Behavioral Health Urgent Care Medical Screening Exam  Patient Name: Eileen [name] MRN: [id number] Date of Evaluation: 11/11/21 Chief Complaint:   Diagnosis:  Final diagnoses:  Heroin abuse ([place])  Methamphetamine abuse ([place])    History of Present illness: Eileen [name] is a 41 y.o. female. Patient presents voluntarily to [facility] behavioral health for walk-in assessment.    Eileen [name] reports she is currently seeking detox and a substance use treatment program because she "wants to get back on track." She reports she relapsed in mid December 2022 on heroin, fentanyl and methamphetamine.  She reports she last used heroin on yesterday.  She reports intravenous heroin use.  She last used methamphetamine today.  She reports daily use times approximately 2 months.  She recently relapsed after 6 months of sobriety.  She was involved in the first step program at [facility] in [place] with 6 months sobriety when she left on a day pass on 09/05/2021 and relapsed.  Recent stressors include her parole that she may have violated when she left the first step program in [place].  She plans to follow-up with her parole officer in [place] as she believes her parole may have been violated when she left the program.  She has most recently been homeless in [place], currently is homeless in [place].  She reports she has been diagnosed with PTSD, anxiety and depression.  She is not currently linked with outpatient psychiatry, no current medications.  No family history of mental health diagnoses reported.  Patient is assessed face-to-face by nurse practitioner. She is seated in assessment area, no acute distress.  She is alert and oriented, pleasant and cooperative during assessment.  She presents with euthymic mood, congruent affect. She denies suicidal and homicidal ideations.  She contracts verbally for safety with this Probation officer. She has normal speech and behavior.  She denies both auditory  and visual hallucinations.  Patient is able to converse coherently with goal-directed thoughts and no distractibility or preoccupation.   Objectively there is no evidence of psychosis/mania or delusional thinking.  Patient endorses decreased sleep and appetite.  Patient offered support and encouragement.   Psychiatric Specialty Exam  Presentation  General Appearance:Appropriate for Environment; Casual  Eye Contact:Good  Speech:Clear and Coherent; Normal Rate  Speech Volume:Normal  Handedness:Right   Mood and Affect  Mood:Euthymic  Affect:Appropriate; Congruent   Thought Process  Thought Processes:Coherent; Goal Directed; Linear  Descriptions of Associations:Intact  Orientation:Full (Time, Place and Person)  Thought Content:Logical; WDL    Hallucinations:None  Ideas of Reference:None  Suicidal Thoughts:No  Homicidal Thoughts:No   Sensorium  Memory:Immediate Good; Recent Good; Remote Good  Judgment:Good  Insight:Fair   Executive Functions  Concentration:Good  Attention Span:Good  [place] of Knowledge:Good  Language:Good   Psychomotor Activity  Psychomotor Activity:Normal   Assets  Assets:Communication Skills; Desire for Improvement; Intimacy; Leisure Time; Physical Health; Resilience; Social Support; Talents/Skills   Sleep  Sleep:Fair  Number of hours: No data recorded  No data recorded  Physical Exam: Physical Exam Vitals and nursing note reviewed.  Constitutional:      Appearance: Normal appearance. She is well-developed.  HENT:     Head: Normocephalic and atraumatic.     Nose: Nose normal.  Cardiovascular:     Rate and Rhythm: Normal rate.  Pulmonary:     Effort: Pulmonary effort is normal.  Musculoskeletal:        General: Normal range of motion.     Cervical back: Normal range of motion.  Skin:  General: Skin is warm and dry.     Findings: Abrasion present.     Comments: Multiple abrasions, left cheek,  bilateral lower legs, bilateral arms  Neurological:     Mental Status: She is alert and oriented to person, place, and time.  Psychiatric:        Attention and Perception: Attention and perception normal.        Mood and Affect: Mood and affect normal.        Speech: Speech normal.        Behavior: Behavior normal. Behavior is cooperative.        Thought Content: Thought content normal.        Cognition and Memory: Cognition and memory normal.   Review of Systems  Constitutional: Negative.   HENT: Negative.    Eyes: Negative.   Respiratory: Negative.    Cardiovascular: Negative.   Gastrointestinal: Negative.   Genitourinary: Negative.   Musculoskeletal: Negative.   Skin: Negative.        Multiple abrasions  Neurological: Negative.   Endo/Heme/Allergies: Negative.   Psychiatric/Behavioral:  Positive for substance abuse.   Blood pressure 128/82, pulse (!) 108, temperature 97.9 F (36.6 C), temperature source Oral, resp. rate 18, SpO2 100 %. There is no height or weight on file to calculate BMI.  Musculoskeletal: Strength & Muscle Tone: within normal limits Gait & Station: normal Patient leans: N/A   [facility] Discharge Disposition for Follow up and Recommendations: Based on my evaluation the patient does not appear to have an emergency medical condition and can be discharged with resources and follow up care in outpatient services for Medication Management, Substance Abuse Intensive Outpatient Program, and Individual Therapy Patient reviewed with Dr. Hampton Abbot. Primary Care provider resource provided.  Follow-up with outpatient psychiatry, resources provided. Follow-up with substance use treatment resources provided.   Lucky Rathke, FNP 11/11/2021, 7:56 PM

## 2021-11-11 NOTE — BH Assessment (Signed)
Pt reports primary substance abuse issues and secondary medical issues of open wounds. Pt reports heroin, meth/fentanyl use, daily. Pt reports passive SI, no plan, no HI or AVH.  Pt is routine/urgent

## 2021-11-11 NOTE — ED Provider Notes (Signed)
[facility] EMERGENCY DEPARTMENT Provider Note   CSN: [id number] Arrival date & time: 11/11/21  2035     History  Chief Complaint  Patient presents with   Detox Heroin Eileen [name] Infections    Eileen [name] is a 41 y.o. female with a past medical history of polysubstance abuse who presents today requesting detox from heroin and for evaluation of skin infections. She was seen today at behavior health urgent care where she was evaluated and given outpatient resources.  Patient states that she is currently withdrawing.  She reports that she last used methamphetamine today.  She also currently uses heroin, last use was yesterday.   HPI     Home Medications Prior to Admission medications   Medication Sig Start Date End Date Taking? Authorizing Provider  buprenorphine-naloxone (SUBOXONE) 8-2 mg SUBL SL tablet Place 1 tablet under the tongue daily.    [provider]  clonazePAM (KLONOPIN) 0.5 MG tablet Take 1 tablet (0.5 mg total) by mouth 2 (two) times daily for 3 days. 08/02/19 08/05/19  Leatha Gilding, MD      Allergies    Sulfa antibiotics    Review of Systems   Review of Systems  Physical Exam Updated Vital Signs BP (!) 149/103    Pulse (!) 102    Temp 98.8 F (37.1 C) (Oral)    Resp 16    SpO2 100%  Physical Exam Vitals and nursing note reviewed.  Constitutional:      General: She is not in acute distress.    Appearance: She is not diaphoretic.  HENT:     Head: Normocephalic and atraumatic.  Eyes:     General: No scleral icterus.       Right eye: No discharge.        Left eye: No discharge.     Conjunctiva/sclera: Conjunctivae normal.  Cardiovascular:     Rate and Rhythm: Regular rhythm. Tachycardia present.  Pulmonary:     Effort: Pulmonary effort is normal. No respiratory distress.     Breath sounds: No stridor.  Abdominal:     General: There is no distension.  Musculoskeletal:        General: No deformity.     Cervical back:  Normal range of motion.  Skin:    General: Skin is warm and dry.     Comments: Please see clinical images.  There are scattered superficial ulcers over bilateral legs with scant purulent drainage and surrounding induration.   Neurological:     Mental Status: She is alert.     Motor: No abnormal muscle tone.     Comments: Patient is awake and alert.  Speech is not slurred.  Answers questions appropriately.  Psychiatric:        Behavior: Behavior normal.    ED Results / Procedures / Treatments   Labs (all labs ordered are listed, but only abnormal results are displayed) Labs Reviewed  COMPREHENSIVE METABOLIC PANEL - Abnormal; Notable for the following components:      Result Value   Sodium 134 (*)    Potassium 3.4 (*)    Chloride 97 (*)    Creatinine, Ser 1.20 (*)    Albumin 3.4 (*)    GFR, Estimated 58 (*)    All other components within normal limits  CBC - Abnormal; Notable for the following components:   Hemoglobin 11.2 (*)    HCT 35.7 (*)    MCH 25.5 (*)    All other components within normal limits  RAPID URINE DRUG SCREEN, HOSP PERFORMED - Abnormal; Notable for the following components:   Amphetamines POSITIVE (*)    Tetrahydrocannabinol POSITIVE (*)    All other components within normal limits  CULTURE, BLOOD (ROUTINE X 2)  CULTURE, BLOOD (ROUTINE X 2)  ETHANOL  I-STAT BETA HCG BLOOD, ED ([facility], [facility], [facility] ONLY)    EKG None  Radiology No results found.  Procedures Procedures    Medications Ordered in ED Medications  dalbavancin (DALVANCE) 1,500 mg in dextrose 5 % 500 mL IVPB (0 mg Intravenous Stopped 11/12/21 0116)    ED Course/ Medical Decision Making/ A&P                           Medical Decision Making Is a 41 year old woman who presents today for concern for multiple skin infections.  She does inject IV drugs, however she denies any injections in her legs and does endorse history of skin picking. On exam she has had multiple scattered shallow ulcers with  scant purulent drainage however none appear to require I&D and are actively draining appropriately. She is slightly tachycardic here however last used amphetamines earlier today and I suspect that this is contributing to her tachycardia as she is afebrile. Sepsis is considered, however she does not meet SIRS criteria as she is afebrile, not tachypneic, and without leukocytosis.    Expresses concern of being able to fill and obtain outpatient medications. I discussed options of Dalvance with pharmacy. Patient is given a dose of Dalvance in the emergency room with plan to discharge after this is completed. She was seen at [facility]  and psychiatrically cleared and given outpatient detox resources.  With patient reporting a history of bacteremia from IVDA will order cultures out of abundance of caution to ensure she isn't bacteremic.   Amount and/or Complexity of Data Reviewed Labs: ordered.    Details: Creatinine is slightly up compared to 2 years ago, unclear if this is a gradual uptrend or acute elevation, recommended increase p.o. intake.  Mild anemia with a hemoglobin of 11.2.  Risk OTC drugs. Prescription drug management. Decision regarding hospitalization. Risk Details: Social factors affecting patients care: Drug use/abuse, unhoused, financial limitations.      Final Clinical Impression(s) / ED Diagnoses Final diagnoses:  Multiple abscesses of both legs    Rx / DC Orders ED Discharge Orders          Ordered    Ambulatory referral to Infectious Disease       Comments: Cellulitis patient:  Received dalbavancin on 11/11/2021.   11/11/21 2301              Cristina Gong, PA-C 11/12/21 1548    Wynetta Fines, MD 11/13/21 [phone]

## 2021-11-11 NOTE — Progress Notes (Signed)
Pharmacy Note:  Dalbavancin for Acute Bacterial Skin and Skin Structure Infection (ABSSSI) Patients to [facility] Discharge Eileen [name] is an 41 y.o. female who presented to [facility] on 11/11/2021 with an Acute Bacterial Skin and Skin Structure Infection  Inclusion criteria - Indication [x]  Moderately large skin lesion (>=75 cm2 or larger - about the size of a baseball) []  Cellulitis  Inclusion Criteria - at least one SIRS criteria present []  WBC > 12,000 or < 4000 []  temp >100.9 or < 96.8 [x]  heart rate >90[]  respiratory rate >20  Patient was evaluated for the following exclusion criteria and no exclusions were found  Extensive discussion with ED providers. Patient is not to be admitted. Patient deemed to be at high risk for non-adherence to PO antibiotics and therefore a high risk for returning to the ED.  ID referral in place.  , PharmD, BCPS 11/11/2021 10:55 PM ED Clinical Pharmacist -  [phone]

## 2021-11-12 ENCOUNTER — Emergency Department (HOSPITAL_COMMUNITY)
Admission: EM | Admit: 2021-11-12 | Discharge: 2021-11-12 | Disposition: A | Discharge status: Home / Self Care | Payer: Self-pay | Attending: Emergency Medicine | Admitting: Emergency Medicine

## 2021-11-12 ENCOUNTER — Other Ambulatory Visit: Payer: Self-pay

## 2021-11-12 ENCOUNTER — Encounter (HOSPITAL_COMMUNITY): Payer: Self-pay | Admitting: Emergency Medicine

## 2021-11-12 DIAGNOSIS — R5383 Other fatigue: Secondary | ICD-10-CM | POA: Insufficient documentation

## 2021-11-12 DIAGNOSIS — Z20822 Contact with and (suspected) exposure to covid-19: Secondary | ICD-10-CM | POA: Insufficient documentation

## 2021-11-12 DIAGNOSIS — R519 Headache, unspecified: Secondary | ICD-10-CM | POA: Insufficient documentation

## 2021-11-12 LAB — RESP PANEL BY RT-PCR (FLU A&B, COVID) ARPGX2
Influenza A by PCR: NEGATIVE
Influenza B by PCR: NEGATIVE
SARS Coronavirus 2 by RT PCR: NEGATIVE

## 2021-11-12 MED ORDER — ACETAMINOPHEN 500 MG PO TABS
1000.0000 mg | ORAL_TABLET | Freq: Once | ORAL | Status: AC
  Administered 2021-11-12: 1000 mg via ORAL
  Filled 2021-11-12: qty 2

## 2021-11-12 NOTE — ED Provider Notes (Signed)
[facility]-EMERGENCY DEPT [facility] Emergency Department Provider Note MRN:  [id number]  Arrival date & time: 11/12/21     Chief Complaint   Headache/Fatigue   History of Present Illness   Eileen [name] is a 41 y.o. year-old female presents to the ED with chief complaint of fatigue and headache.  She was seen earlier last night for abscesses and requesting detox from heroin.  She was given Dalvance and discharged from the ED with instructions for outpatient follow-up.  She was also seen at behavioral health urgent care and was released with outpatient follow-up.  History provided by patient.  Review of Systems  Pertinent review of systems noted in HPI.    Physical Exam   Vitals:   11/12/21 0352 11/12/21 0415  BP: (!) 144/97 (!) 132/93  Pulse: (!) 110 (!) 102  Resp: 16 16  Temp: 98.7 F (37.1 C)   SpO2: 98% 100%    CONSTITUTIONAL:  non-toxic-appearing, NAD NEURO:  Alert and oriented x 3, CN 3-12 grossly intact EYES:  eyes equal and reactive ENT/NECK:  Supple, no stridor  CARDIO:  normal rate on my exam, regular rhythm, appears well-perfused  PULM:  No respiratory distress, dry cough GI/GU:  non-distended,  MSK/SPINE:  No gross deformities, no edema, moves all extremities  SKIN:  no rash, atraumatic   *Additional and/or pertinent findings included in MDM below  Diagnostic and Interventional Summary    EKG Interpretation  Date/Time:    Ventricular Rate:    PR Interval:    QRS Duration:   QT Interval:    QTC Calculation:   R Axis:     Text Interpretation:         Labs Reviewed  RESP PANEL BY RT-PCR (FLU A&B, COVID) [id number]    No orders to display    Medications - No data to display   Procedures  /  Critical Care Procedures  ED Course and Medical Decision Making  I have reviewed the triage vital signs, the nursing notes, and pertinent available records from the EMR.  Complexity of Problems Addressed: Moderate Complexity: Chronic illness with  exacerbation (poorly controlled chronic illness, but not requiring further diagnostics or treatment in ED )  ED Course: After considering the following differential, covid, flu, I ordered covid test. I personally interpreted the labs which are notable for negative for covid and flu .     Social Determinants Affecting Care: Drug abuse/addiction Access to medical care  Provided with outpatient resources.  Consultants: No consultations were needed in caring for this patient.  Treatment and Plan: COVID and flu test were negative.  Patient was already seen yesterday at behavioral health urgent care and in the ED for detox from heroin.  She was given outpatient resources.  Still feel that this is appropriate.  She was also treated for her skin abscesses with Dalvance.  At this time, do not feel that any further emergent work-up or treatment is indicated.  Emergency department workup does not suggest an emergent condition requiring admission or immediate intervention beyond  what has been performed at this time. The patient is safe for discharge and has  been instructed to return immediately for worsening symptoms, change in  symptoms or any other concerns    Final Clinical Impressions(s) / ED Diagnoses     ICD-10-CM   1. Other fatigue  R53.83       ED Discharge Orders     None         Discharge Instructions Discussed with  and Provided to Patient:    Discharge Instructions      Your COVID and flu test were negative.  Please follow-up at the [facility] and [facility].        Roxy Horseman, PA-C 11/12/21 7564    Shon Baton, MD 11/12/21 [phone]

## 2021-11-12 NOTE — ED Triage Notes (Signed)
Patient reports headache and fatigue this evening.

## 2021-11-12 NOTE — ED Notes (Signed)
Pt verbalized understanding of d/c instructions, meds, and followup care. Denies questions. VSS, no distress noted. Steady gait to exit with all belongings.  ?

## 2021-11-12 NOTE — Discharge Instructions (Addendum)
Your COVID and flu test were negative.  Please follow-up at the [facility].

## 2021-11-17 LAB — CULTURE, BLOOD (ROUTINE X 2)
Culture: NO GROWTH
Culture: NO GROWTH
